# Patient Record
Sex: Male | Born: 1940 | Race: White | Hispanic: No | Marital: Married | Smoking: Never smoker
Health system: Southern US, Community
[De-identification: ages and names within clinical notes are randomized; demographics above are authoritative.]

## PROBLEM LIST (undated history)

## (undated) DIAGNOSIS — I2510 Atherosclerotic heart disease of native coronary artery without angina pectoris: Secondary | ICD-10-CM

## (undated) DIAGNOSIS — K219 Gastro-esophageal reflux disease without esophagitis: Secondary | ICD-10-CM

## (undated) DIAGNOSIS — I10 Essential (primary) hypertension: Secondary | ICD-10-CM

## (undated) DIAGNOSIS — C801 Malignant (primary) neoplasm, unspecified: Secondary | ICD-10-CM

## (undated) DIAGNOSIS — E785 Hyperlipidemia, unspecified: Secondary | ICD-10-CM

## (undated) HISTORY — DX: Hyperlipidemia, unspecified: E78.5

## (undated) HISTORY — DX: Gastro-esophageal reflux disease without esophagitis: K21.9

## (undated) HISTORY — DX: Malignant (primary) neoplasm, unspecified: C80.1

## (undated) HISTORY — PX: LOOP RECORDER IMPLANT: SHX5954

## (undated) HISTORY — DX: Essential (primary) hypertension: I10

## (undated) HISTORY — DX: Atherosclerotic heart disease of native coronary artery without angina pectoris: I25.10

## (undated) HISTORY — PX: CARDIAC SURGERY: SHX584

---

## 2004-04-20 ENCOUNTER — Ambulatory Visit (HOSPITAL_COMMUNITY): Admission: RE | Admit: 2004-04-20 | Discharge: 2004-04-20 | Payer: Self-pay | Admitting: Plastic Surgery

## 2011-08-25 ENCOUNTER — Ambulatory Visit: Payer: Self-pay | Admitting: Cardiology

## 2013-01-22 ENCOUNTER — Ambulatory Visit (INDEPENDENT_AMBULATORY_CARE_PROVIDER_SITE_OTHER): Payer: Medicare Other | Admitting: Podiatrist

## 2013-01-22 ENCOUNTER — Encounter: Payer: Self-pay | Admitting: Podiatrist

## 2013-01-22 DIAGNOSIS — L600 Ingrowing nail: Secondary | ICD-10-CM

## 2013-01-22 NOTE — Progress Notes (Signed)
°  Chief Complaint  Patient presents with   Ingrown Toenail    right big toenail     HPI: Patient is 72 y.o. male who presents today for painful ingrown toenail on the right great toenail. In the past I performed a simple debridement of the toenail which she states helps temporarily however for the past 3 weeks it's been touch she and tender. He's interested in a permanent fix for this toenail.  Physical Exam  GENERAL APPEARANCE: Alert, conversant. Appropriately groomed. No acute distress.  VASCULAR: Pedal pulses palpable and strong bilateral.  Capillary refill time is immediate to all digits,  Proximal to distal cooling it warm to warm.  Digital hair growth is present bilateral  NEUROLOGIC: sensation is intact epicritically and protectively to 5.07 monofilament at 5/5 sites bilateral.  Light touch is intact bilateral, vibratory sensation intact bilateral, achilles tendon reflex is intact bilateral.  MUSCULOSKELETAL: acceptable muscle strength, tone and stability bilateral.  Intrinsic muscluature intact bilateral.  Rectus appearance of foot and digits noted bilateral.   DERMATOLOGIC: skin color, texture, and turger are within normal limits.  No preulcerative lesions are seen, no interdigital maceration noted.  No open lesions present.  Digital nails are asymptomatic. With the exception of the right first medial aspect. Tender and symptomatic especially along the posterior medial border the toenail. No redness, no swelling, no drainage, no malodor noted.  Assessment: Ingrown right hallux nail medial nail border  Plan:Treatment options and alternatives discussed.  Recommended permanent phenol matrixectomy and patient agreed.  Right hallux was prepped with alcohol and a 1 to 1 mix of 0.5% marcaine plain and 2% lidocaine plain was administered in a digital block fashion.  The toe was then prepped with betadine solution and exsanguinated.  The offending nail border was then excised and matrix tissue  exposed.  Phenol was then applied to the matrix tissue followed by an alcohol wash.  Antibiotic ointment and a dry sterile dressing was applied.  The patient was dispensed instructions for aftercare.    Marlowe Aschoff DPM  EGERTON, KATHRYN P

## 2013-01-22 NOTE — Patient Instructions (Signed)

## 2014-03-05 DIAGNOSIS — I4892 Unspecified atrial flutter: Secondary | ICD-10-CM | POA: Diagnosis not present

## 2014-03-05 DIAGNOSIS — I2510 Atherosclerotic heart disease of native coronary artery without angina pectoris: Secondary | ICD-10-CM | POA: Diagnosis not present

## 2014-03-05 DIAGNOSIS — E785 Hyperlipidemia, unspecified: Secondary | ICD-10-CM | POA: Diagnosis not present

## 2014-03-05 DIAGNOSIS — R55 Syncope and collapse: Secondary | ICD-10-CM | POA: Diagnosis not present

## 2014-03-05 DIAGNOSIS — Z959 Presence of cardiac and vascular implant and graft, unspecified: Secondary | ICD-10-CM | POA: Diagnosis not present

## 2014-03-28 DIAGNOSIS — Z79899 Other long term (current) drug therapy: Secondary | ICD-10-CM | POA: Diagnosis not present

## 2014-03-28 DIAGNOSIS — Z0000 Encounter for general adult medical examination without abnormal findings: Secondary | ICD-10-CM | POA: Diagnosis not present

## 2014-03-28 DIAGNOSIS — Z125 Encounter for screening for malignant neoplasm of prostate: Secondary | ICD-10-CM | POA: Diagnosis not present

## 2014-03-28 DIAGNOSIS — E782 Mixed hyperlipidemia: Secondary | ICD-10-CM | POA: Diagnosis not present

## 2014-04-02 DIAGNOSIS — L821 Other seborrheic keratosis: Secondary | ICD-10-CM | POA: Diagnosis not present

## 2014-04-02 DIAGNOSIS — L820 Inflamed seborrheic keratosis: Secondary | ICD-10-CM | POA: Diagnosis not present

## 2014-08-28 DIAGNOSIS — H5213 Myopia, bilateral: Secondary | ICD-10-CM | POA: Diagnosis not present

## 2014-08-28 DIAGNOSIS — H524 Presbyopia: Secondary | ICD-10-CM | POA: Diagnosis not present

## 2014-11-06 DIAGNOSIS — B9689 Other specified bacterial agents as the cause of diseases classified elsewhere: Secondary | ICD-10-CM | POA: Diagnosis not present

## 2014-11-06 DIAGNOSIS — J208 Acute bronchitis due to other specified organisms: Secondary | ICD-10-CM | POA: Diagnosis not present

## 2014-11-17 DIAGNOSIS — J0100 Acute maxillary sinusitis, unspecified: Secondary | ICD-10-CM | POA: Diagnosis not present

## 2014-12-05 DIAGNOSIS — Z23 Encounter for immunization: Secondary | ICD-10-CM | POA: Diagnosis not present

## 2015-04-01 DIAGNOSIS — Z79899 Other long term (current) drug therapy: Secondary | ICD-10-CM | POA: Diagnosis not present

## 2015-04-01 DIAGNOSIS — Z125 Encounter for screening for malignant neoplasm of prostate: Secondary | ICD-10-CM | POA: Diagnosis not present

## 2015-04-01 DIAGNOSIS — E782 Mixed hyperlipidemia: Secondary | ICD-10-CM | POA: Diagnosis not present

## 2015-04-02 DIAGNOSIS — Z95818 Presence of other cardiac implants and grafts: Secondary | ICD-10-CM | POA: Diagnosis not present

## 2015-04-02 DIAGNOSIS — Z1389 Encounter for screening for other disorder: Secondary | ICD-10-CM | POA: Diagnosis not present

## 2015-04-02 DIAGNOSIS — Z8679 Personal history of other diseases of the circulatory system: Secondary | ICD-10-CM | POA: Insufficient documentation

## 2015-04-02 DIAGNOSIS — Z9181 History of falling: Secondary | ICD-10-CM | POA: Diagnosis not present

## 2015-04-02 DIAGNOSIS — Z9889 Other specified postprocedural states: Secondary | ICD-10-CM

## 2015-04-02 DIAGNOSIS — R55 Syncope and collapse: Secondary | ICD-10-CM

## 2015-04-02 DIAGNOSIS — Z0001 Encounter for general adult medical examination with abnormal findings: Secondary | ICD-10-CM | POA: Diagnosis not present

## 2015-04-02 DIAGNOSIS — I2510 Atherosclerotic heart disease of native coronary artery without angina pectoris: Secondary | ICD-10-CM

## 2015-04-02 DIAGNOSIS — G4731 Primary central sleep apnea: Secondary | ICD-10-CM

## 2015-04-02 DIAGNOSIS — I472 Ventricular tachycardia, unspecified: Secondary | ICD-10-CM | POA: Insufficient documentation

## 2015-04-02 DIAGNOSIS — R05 Cough: Secondary | ICD-10-CM | POA: Diagnosis not present

## 2015-04-02 HISTORY — DX: Ventricular tachycardia, unspecified: I47.20

## 2015-04-02 HISTORY — DX: Atherosclerotic heart disease of native coronary artery without angina pectoris: I25.10

## 2015-04-02 HISTORY — DX: Personal history of other diseases of the circulatory system: Z98.890

## 2015-04-02 HISTORY — DX: Ventricular tachycardia: I47.2

## 2015-04-02 HISTORY — DX: Primary central sleep apnea: G47.31

## 2015-04-02 HISTORY — DX: Syncope and collapse: R55

## 2015-04-02 HISTORY — DX: Personal history of other diseases of the circulatory system: Z86.79

## 2015-04-08 DIAGNOSIS — Z1211 Encounter for screening for malignant neoplasm of colon: Secondary | ICD-10-CM | POA: Diagnosis not present

## 2015-06-05 DIAGNOSIS — Z79899 Other long term (current) drug therapy: Secondary | ICD-10-CM | POA: Insufficient documentation

## 2015-06-05 HISTORY — DX: Other long term (current) drug therapy: Z79.899

## 2015-08-13 DIAGNOSIS — K219 Gastro-esophageal reflux disease without esophagitis: Secondary | ICD-10-CM | POA: Diagnosis not present

## 2015-09-01 DIAGNOSIS — Z85820 Personal history of malignant melanoma of skin: Secondary | ICD-10-CM | POA: Diagnosis not present

## 2015-09-01 DIAGNOSIS — Z7982 Long term (current) use of aspirin: Secondary | ICD-10-CM | POA: Diagnosis not present

## 2015-09-01 DIAGNOSIS — I252 Old myocardial infarction: Secondary | ICD-10-CM | POA: Diagnosis not present

## 2015-09-01 DIAGNOSIS — Z955 Presence of coronary angioplasty implant and graft: Secondary | ICD-10-CM | POA: Diagnosis not present

## 2015-09-01 DIAGNOSIS — Z79899 Other long term (current) drug therapy: Secondary | ICD-10-CM | POA: Diagnosis not present

## 2015-09-01 DIAGNOSIS — Z86010 Personal history of colonic polyps: Secondary | ICD-10-CM | POA: Diagnosis not present

## 2015-09-14 DIAGNOSIS — H2513 Age-related nuclear cataract, bilateral: Secondary | ICD-10-CM | POA: Diagnosis not present

## 2015-09-14 DIAGNOSIS — H524 Presbyopia: Secondary | ICD-10-CM | POA: Diagnosis not present

## 2015-09-29 DIAGNOSIS — E782 Mixed hyperlipidemia: Secondary | ICD-10-CM | POA: Diagnosis not present

## 2015-09-29 DIAGNOSIS — Z79899 Other long term (current) drug therapy: Secondary | ICD-10-CM | POA: Diagnosis not present

## 2015-11-05 DIAGNOSIS — I472 Ventricular tachycardia: Secondary | ICD-10-CM | POA: Diagnosis not present

## 2015-11-05 DIAGNOSIS — I2510 Atherosclerotic heart disease of native coronary artery without angina pectoris: Secondary | ICD-10-CM | POA: Diagnosis not present

## 2015-11-05 DIAGNOSIS — R55 Syncope and collapse: Secondary | ICD-10-CM | POA: Diagnosis not present

## 2015-11-05 DIAGNOSIS — Z79899 Other long term (current) drug therapy: Secondary | ICD-10-CM | POA: Diagnosis not present

## 2015-11-05 DIAGNOSIS — Z95818 Presence of other cardiac implants and grafts: Secondary | ICD-10-CM | POA: Diagnosis not present

## 2015-11-16 DIAGNOSIS — L570 Actinic keratosis: Secondary | ICD-10-CM | POA: Diagnosis not present

## 2015-11-16 DIAGNOSIS — L821 Other seborrheic keratosis: Secondary | ICD-10-CM | POA: Diagnosis not present

## 2015-11-16 DIAGNOSIS — R209 Unspecified disturbances of skin sensation: Secondary | ICD-10-CM | POA: Diagnosis not present

## 2015-11-16 DIAGNOSIS — Z23 Encounter for immunization: Secondary | ICD-10-CM | POA: Diagnosis not present

## 2016-04-08 DIAGNOSIS — Z125 Encounter for screening for malignant neoplasm of prostate: Secondary | ICD-10-CM | POA: Diagnosis not present

## 2016-04-08 DIAGNOSIS — Z08 Encounter for follow-up examination after completed treatment for malignant neoplasm: Secondary | ICD-10-CM | POA: Diagnosis not present

## 2016-04-08 DIAGNOSIS — Z79899 Other long term (current) drug therapy: Secondary | ICD-10-CM | POA: Diagnosis not present

## 2016-04-08 DIAGNOSIS — K219 Gastro-esophageal reflux disease without esophagitis: Secondary | ICD-10-CM | POA: Diagnosis not present

## 2016-04-08 DIAGNOSIS — Z0000 Encounter for general adult medical examination without abnormal findings: Secondary | ICD-10-CM | POA: Diagnosis not present

## 2016-04-08 DIAGNOSIS — E782 Mixed hyperlipidemia: Secondary | ICD-10-CM | POA: Diagnosis not present

## 2016-04-13 DIAGNOSIS — D649 Anemia, unspecified: Secondary | ICD-10-CM | POA: Diagnosis not present

## 2016-04-28 DIAGNOSIS — I10 Essential (primary) hypertension: Secondary | ICD-10-CM

## 2016-04-28 DIAGNOSIS — R55 Syncope and collapse: Secondary | ICD-10-CM | POA: Diagnosis not present

## 2016-04-28 DIAGNOSIS — G4731 Primary central sleep apnea: Secondary | ICD-10-CM | POA: Diagnosis not present

## 2016-04-28 DIAGNOSIS — I2510 Atherosclerotic heart disease of native coronary artery without angina pectoris: Secondary | ICD-10-CM | POA: Diagnosis not present

## 2016-04-28 DIAGNOSIS — Z95818 Presence of other cardiac implants and grafts: Secondary | ICD-10-CM | POA: Diagnosis not present

## 2016-04-28 HISTORY — DX: Essential (primary) hypertension: I10

## 2016-07-17 DIAGNOSIS — M545 Low back pain: Secondary | ICD-10-CM | POA: Diagnosis not present

## 2016-09-09 ENCOUNTER — Encounter: Payer: Self-pay | Admitting: Cardiology

## 2016-09-09 ENCOUNTER — Ambulatory Visit (INDEPENDENT_AMBULATORY_CARE_PROVIDER_SITE_OTHER): Payer: Medicare Other | Admitting: Cardiology

## 2016-09-09 ENCOUNTER — Ambulatory Visit (HOSPITAL_BASED_OUTPATIENT_CLINIC_OR_DEPARTMENT_OTHER)
Admission: RE | Admit: 2016-09-09 | Discharge: 2016-09-09 | Disposition: A | Discharge status: Home / Self Care | Payer: Medicare Other | Source: Ambulatory Visit | Attending: Cardiology | Admitting: Cardiology

## 2016-09-09 VITALS — BP 110/60 | HR 68 | Resp 10 | Ht 72.0 in | Wt 186.0 lb

## 2016-09-09 DIAGNOSIS — I472 Ventricular tachycardia, unspecified: Secondary | ICD-10-CM

## 2016-09-09 DIAGNOSIS — R0602 Shortness of breath: Secondary | ICD-10-CM | POA: Diagnosis not present

## 2016-09-09 DIAGNOSIS — I209 Angina pectoris, unspecified: Secondary | ICD-10-CM | POA: Diagnosis not present

## 2016-09-09 DIAGNOSIS — I2510 Atherosclerotic heart disease of native coronary artery without angina pectoris: Secondary | ICD-10-CM

## 2016-09-09 DIAGNOSIS — G4731 Primary central sleep apnea: Secondary | ICD-10-CM | POA: Diagnosis not present

## 2016-09-09 DIAGNOSIS — I10 Essential (primary) hypertension: Secondary | ICD-10-CM

## 2016-09-09 HISTORY — DX: Angina pectoris, unspecified: I20.9

## 2016-09-09 MED ORDER — NITROGLYCERIN 0.4 MG SL SUBL: 0.4000 mg | SUBLINGUAL_TABLET | SUBLINGUAL | 11 refills | Status: AC | PRN

## 2016-09-09 MED ORDER — ROSUVASTATIN CALCIUM 20 MG PO TABS: 20.0000 mg | ORAL_TABLET | Freq: Every day | ORAL | 3 refills | Status: DC

## 2016-09-09 MED ORDER — RANOLAZINE ER 500 MG PO TB12: 500.0000 mg | ORAL_TABLET | Freq: Two times a day (BID) | ORAL | 3 refills | Status: DC

## 2016-09-09 NOTE — Addendum Note (Signed)
Addended by: Warner Mccreedy E on: 09/09/2016 10:48 AM   Modules accepted: Orders

## 2016-09-09 NOTE — Progress Notes (Signed)
Cardiology Office Note:    Date:  09/09/2016   ID:  Scott Whitehead, DOB 09/12/1940, MRN [id number]  PCP:  Myer Peer, MD  Cardiologist:  Jenne Campus, MD    Referring MD: Myer Peer, MD   Chief Complaint  Patient presents with  . Follow-up  I'm having chest pain  History of Present Illness:    Scott Whitehead is a 76 y.o. male  with past medical history significant for coronary artery disease. 6-7 years ago he did have angioplasty done. I do not have details about which artery was intervened on at the moment. He requested to be seen because of exertional chest pain that he develop about a month ago. Anymore strenuous exercise right now will give him chest tightness he stops and pain goes away. Even walking at [facility] from front to the back will bring the pain. He said this is exactly the same thing that I had before on my last angioplasty. He is a red on beta blocker but does not have nitroglycerin to try. We talked about options in this situation option being medical therapy versus stress testing versus cardiac catheterization. He does have past medical history of coronary artery disease, he symptoms of typical I told him that the best approach to the situation will be to go straight for cardiac catheterization. Procedure was explained to him including all risk benefits as well as alternative. He agreed to proceed.  Past Medical History:  Diagnosis Date  . Cancer ([place])   . GERD (gastroesophageal reflux disease)   . Hyperlipidemia   . Hypertension     Past Surgical History:  Procedure Laterality Date  . CARDIAC SURGERY     heart monitor is placed in the heart area  . LOOP RECORDER IMPLANT      Current Medications: Current Meds  Medication Sig  . aspirin 81 MG tablet Take 81 mg by mouth daily.  Marland Kitchen lisinopril (PRINIVIL,ZESTRIL) 5 MG tablet Take 5 mg by mouth daily.  . metoprolol tartrate (LOPRESSOR) 25 MG tablet Take 25 mg by mouth 2 (two) times daily.  . Multiple  Vitamin (MULTIVITAMIN) tablet Take 1 tablet by mouth daily.  . Omega-3 Fatty Acids (FISH OIL) 1000 MG CAPS Take by mouth.  . pantoprazole (PROTONIX) 40 MG tablet Take 40 mg by mouth daily.  . Potassium 99 MG TABS Take 1 tablet by mouth daily.  . simvastatin (ZOCOR) 20 MG tablet Take 20 mg by mouth daily.     Allergies:   Patient has no known allergies.   Social History   Social History  . Marital status: Married    Spouse name: N/A  . Number of children: N/A  . Years of education: N/A   Social History Main Topics  . Smoking status: Never Smoker  . Smokeless tobacco: Never Used  . Alcohol use No  . Drug use: No  . Sexual activity: Not Asked   Other Topics Concern  . None   Social History Narrative  . None     Family History: The patient's family history includes Alzheimer's disease in his father; Blindness in his father; Glaucoma in his father; Heart disease in his mother; Hypertension in his father and mother. ROS:   Please see the history of present illness.    All 14 point review of systems negative except as described per history of present illness  EKGs/Labs/Other Studies Reviewed:      Recent Labs: No results found for requested labs within last 8760 hours.  Recent Lipid Panel No results found for: CHOL, TRIG, HDL, CHOLHDL, VLDL, LDLCALC, LDLDIRECT  Physical Exam:    VS:  BP 110/60   Pulse 68   Resp 10   Ht 6' (1.829 m)   Wt 186 lb (84.4 kg)   BMI 25.23 kg/m     Wt Readings from Last 3 Encounters:  09/09/16 186 lb (84.4 kg)     GEN:  Well nourished, well developed in no acute distress HEENT: Normal NECK: No JVD; No carotid bruits LYMPHATICS: No lymphadenopathy CARDIAC: RRR, no murmurs, no rubs, no gallops RESPIRATORY:  Clear to auscultation without rales, wheezing or rhonchi  ABDOMEN: Soft, non-tender, non-distended MUSCULOSKELETAL:  No edema; No deformity  SKIN: Warm and dry LOWER EXTREMITIES: no swelling NEUROLOGIC:  Alert and oriented x  3 PSYCHIATRIC:  Normal affect   ASSESSMENT:    1. Coronary artery disease involving native coronary artery of native heart without angina pectoris   2. Essential hypertension   3. VT (ventricular tachycardia) ([place])   4. Primary central sleep apnea   5. Angina pectoris ([place])    PLAN:    In order of problems listed above:  1. Typical angina pectoris in this gentleman with history of coronary artery disease. I asked him to continue aspirin, I will switch his low intensity statin to high intensity statin I will give him Crestor 20 mg daily. He is already on beta blocker which I will continue. I will opt ranolazine 500 mg twice daily to his medical regimen as well as nitroglycerin that had advised him to take on when necessary basis. I also told him that was does not help with the pain he needs to go to the emergency room. He will be scheduled to have cardiac catheterization. 2. Essential hypertension: Blood pressure appears to be well-controlled will continue present management. 3. History of ventricular tachycardia: He was evaluated by Dr. Minna Merritts. He does have implantable loop recorder however the battery already drained out and device is not functioning. It never recorded any significant arrhythmia. In the future we will plan to remove it. 4. Sleep apnea: That's been followed by internal medicine team.   Medication Adjustments/Labs and Tests Ordered: Current medicines are reviewed at length with the patient today.  Concerns regarding medicines are outlined above.  No orders of the defined types were placed in this encounter.  Medication changes: No orders of the defined types were placed in this encounter.   Signed, Park Liter, MD, [facility] 09/09/2016 10:25 AM    [place]

## 2016-09-09 NOTE — Patient Instructions (Addendum)
Medication Instructions:  Your physician has recommended you make the following change in your medication:  STOP simvastatin START rosuvastatin (Crestor) 20 mg daily START ranolazine (Ranexa) 500 mg tablet twice daily START nitroglycerin 0.4 mg sublingual (under your tongue) as needed. When having chest pain, stop what you are doing and sit down. Take 1 nitro, wait 5 minutes. Still having chest pain, take 1 nitro, wait 5 minutes. Still having chest pain, take 1 nitro, dial 911. Total of 3 nitro in 15 minutes.   Labwork: Your physician recommends that you return for lab work in: today. CMP, CBC   Testing/Procedures: You will have a cardiac catherization 09/09/16.  Follow-Up: Your physician recommends that you schedule a follow-up appointment in: 1 month   Any Other Special Instructions Will Be Listed Below (If Applicable).     If you need a refill on your cardiac medications before your next appointment, please call your pharmacy.

## 2016-09-09 NOTE — Addendum Note (Signed)
Addended by: Warner Mccreedy E on: 09/09/2016 10:56 AM   Modules accepted: Orders

## 2016-09-10 LAB — COMPREHENSIVE METABOLIC PANEL
A/G RATIO: 2 (ref 1.2–2.2)
ALBUMIN: 4.3 g/dL (ref 3.5–4.8)
ALT: 18 IU/L (ref 0–44)
AST: 24 IU/L (ref 0–40)
Alkaline Phosphatase: 56 IU/L (ref 39–117)
BILIRUBIN TOTAL: 0.6 mg/dL (ref 0.0–1.2)
BUN / CREAT RATIO: 24 (ref 10–24)
BUN: 24 mg/dL (ref 8–27)
CHLORIDE: 103 mmol/L (ref 96–106)
CO2: 25 mmol/L (ref 20–29)
Calcium: 9.5 mg/dL (ref 8.6–10.2)
Creatinine, Ser: 1 mg/dL (ref 0.76–1.27)
GFR, EST AFRICAN AMERICAN: 85 mL/min/{1.73_m2} (ref >59)
GFR, EST NON AFRICAN AMERICAN: 73 mL/min/{1.73_m2} (ref >59)
GLUCOSE: 76 mg/dL (ref 65–99)
Globulin, Total: 2.2 g/dL (ref 1.5–4.5)
POTASSIUM: 5.5 mmol/L — AB (ref 3.5–5.2)
Sodium: 141 mmol/L (ref 134–144)
Total Protein: 6.5 g/dL (ref 6.0–8.5)

## 2016-09-10 LAB — CBC WITH DIFFERENTIAL/PLATELET
BASOS: 0 %
Basophils Absolute: 0 10*3/uL (ref 0.0–0.2)
EOS (ABSOLUTE): 0.3 10*3/uL (ref 0.0–0.4)
EOS: 5 %
HEMATOCRIT: 42.9 % (ref 37.5–51.0)
Hemoglobin: 13.9 g/dL (ref 13.0–17.7)
Immature Grans (Abs): 0 10*3/uL (ref 0.0–0.1)
Immature Granulocytes: 0 %
LYMPHS: 28 %
Lymphocytes Absolute: 1.3 10*3/uL (ref 0.7–3.1)
MCH: 32.2 pg (ref 26.6–33.0)
MCHC: 32.4 g/dL (ref 31.5–35.7)
MCV: 99 fL — AB (ref 79–97)
MONOS ABS: 0.4 10*3/uL (ref 0.1–0.9)
Monocytes: 10 %
NEUTROS PCT: 57 %
Neutrophils Absolute: 2.6 10*3/uL (ref 1.4–7.0)
PLATELETS: 190 10*3/uL (ref 150–379)
RBC: 4.32 x10E6/uL (ref 4.14–5.80)
RDW: 14.2 % (ref 12.3–15.4)
WBC: 4.6 10*3/uL (ref 3.4–10.8)

## 2016-09-12 ENCOUNTER — Telehealth: Payer: Self-pay

## 2016-09-12 NOTE — Telephone Encounter (Signed)
Left detailed message per DPR.  Patient contacted pre-catheterization at [facility] scheduled for:  09/13/2016 @ 1330 Verified arrival time and place:  NT @ 1130  Confirmed AM meds to be taken pre-cath with sip of water:  Notified to take baby ASA prior to arrival  Notified patient must have responsible person to drive home post procedure and observe patient for 24 hours Addl concerns:  Left this nurse name and # for call back if any questions.

## 2016-09-13 ENCOUNTER — Ambulatory Visit (HOSPITAL_COMMUNITY)
Admission: RE | Admit: 2016-09-13 | Discharge: 2016-09-13 | Disposition: A | Discharge status: Home / Self Care | Payer: Medicare Other | Source: Ambulatory Visit | Attending: Cardiology | Admitting: Cardiology

## 2016-09-13 ENCOUNTER — Encounter (HOSPITAL_COMMUNITY)
Admission: RE | Disposition: A | Discharge status: Home / Self Care | Payer: Self-pay | Source: Ambulatory Visit | Attending: Cardiology

## 2016-09-13 DIAGNOSIS — G4731 Primary central sleep apnea: Secondary | ICD-10-CM | POA: Diagnosis not present

## 2016-09-13 DIAGNOSIS — E785 Hyperlipidemia, unspecified: Secondary | ICD-10-CM

## 2016-09-13 DIAGNOSIS — I209 Angina pectoris, unspecified: Secondary | ICD-10-CM | POA: Diagnosis present

## 2016-09-13 DIAGNOSIS — I25119 Atherosclerotic heart disease of native coronary artery with unspecified angina pectoris: Secondary | ICD-10-CM | POA: Diagnosis not present

## 2016-09-13 DIAGNOSIS — I472 Ventricular tachycardia: Secondary | ICD-10-CM | POA: Insufficient documentation

## 2016-09-13 DIAGNOSIS — Z7982 Long term (current) use of aspirin: Secondary | ICD-10-CM | POA: Diagnosis not present

## 2016-09-13 DIAGNOSIS — Z9861 Coronary angioplasty status: Secondary | ICD-10-CM

## 2016-09-13 DIAGNOSIS — I2510 Atherosclerotic heart disease of native coronary artery without angina pectoris: Secondary | ICD-10-CM

## 2016-09-13 DIAGNOSIS — I10 Essential (primary) hypertension: Secondary | ICD-10-CM | POA: Diagnosis not present

## 2016-09-13 DIAGNOSIS — K219 Gastro-esophageal reflux disease without esophagitis: Secondary | ICD-10-CM | POA: Diagnosis not present

## 2016-09-13 HISTORY — DX: Hyperlipidemia, unspecified: E78.5

## 2016-09-13 HISTORY — PX: LEFT HEART CATH AND CORONARY ANGIOGRAPHY: CATH118249

## 2016-09-13 LAB — PROTIME-INR
INR: 1.09
Prothrombin Time: 14.1 seconds (ref 11.4–15.2)

## 2016-09-13 SURGERY — LEFT HEART CATH AND CORONARY ANGIOGRAPHY
Anesthesia: LOCAL

## 2016-09-13 MED ORDER — MIDAZOLAM HCL 2 MG/2ML IJ SOLN
INTRAMUSCULAR | Status: DC | PRN
  Administered 2016-09-13: 2 mg via INTRAVENOUS

## 2016-09-13 MED ORDER — HEPARIN (PORCINE) IN NACL 2-0.9 UNIT/ML-% IJ SOLN
INTRAMUSCULAR | Status: AC
  Filled 2016-09-13: qty 1000

## 2016-09-13 MED ORDER — SODIUM CHLORIDE 0.9% FLUSH: 3.0000 mL | INTRAVENOUS | Status: DC | PRN

## 2016-09-13 MED ORDER — LIDOCAINE HCL (PF) 1 % IJ SOLN
INTRAMUSCULAR | Status: AC
  Filled 2016-09-13: qty 30

## 2016-09-13 MED ORDER — FENTANYL CITRATE (PF) 100 MCG/2ML IJ SOLN
INTRAMUSCULAR | Status: AC
Start: 2016-09-13 — End: ?
  Filled 2016-09-13: qty 2

## 2016-09-13 MED ORDER — IOPAMIDOL (ISOVUE-370) INJECTION 76%
INTRAVENOUS | Status: AC
  Filled 2016-09-13: qty 100

## 2016-09-13 MED ORDER — ACETAMINOPHEN 325 MG PO TABS: 650.0000 mg | ORAL_TABLET | ORAL | Status: DC | PRN

## 2016-09-13 MED ORDER — SODIUM CHLORIDE 0.9 % IV SOLN: 250.0000 mL | INTRAVENOUS | Status: DC | PRN

## 2016-09-13 MED ORDER — VERAPAMIL HCL 2.5 MG/ML IV SOLN
INTRAVENOUS | Status: DC | PRN
  Administered 2016-09-13: 15:00:00 via INTRA_ARTERIAL

## 2016-09-13 MED ORDER — VERAPAMIL HCL 2.5 MG/ML IV SOLN
INTRAVENOUS | Status: AC
  Filled 2016-09-13: qty 2

## 2016-09-13 MED ORDER — MIDAZOLAM HCL 2 MG/2ML IJ SOLN
INTRAMUSCULAR | Status: AC
  Filled 2016-09-13: qty 2

## 2016-09-13 MED ORDER — SODIUM CHLORIDE 0.9 % WEIGHT BASED INFUSION
3.0000 mL/kg/h | INTRAVENOUS | Status: AC
  Administered 2016-09-13: 3 mL/kg/h via INTRAVENOUS

## 2016-09-13 MED ORDER — IOPAMIDOL (ISOVUE-370) INJECTION 76%
INTRAVENOUS | Status: DC | PRN
  Administered 2016-09-13: 75 mL via INTRA_ARTERIAL

## 2016-09-13 MED ORDER — SODIUM CHLORIDE 0.9 % WEIGHT BASED INFUSION: 1.0000 mL/kg/h | INTRAVENOUS | Status: DC

## 2016-09-13 MED ORDER — SODIUM CHLORIDE 0.9 % IV SOLN: INTRAVENOUS | Status: DC

## 2016-09-13 MED ORDER — SODIUM CHLORIDE 0.9% FLUSH: 3.0000 mL | Freq: Two times a day (BID) | INTRAVENOUS | Status: DC

## 2016-09-13 MED ORDER — FENTANYL CITRATE (PF) 100 MCG/2ML IJ SOLN
INTRAMUSCULAR | Status: DC | PRN
  Administered 2016-09-13: 25 ug via INTRAVENOUS

## 2016-09-13 MED ORDER — ASPIRIN 81 MG PO CHEW: 81.0000 mg | CHEWABLE_TABLET | ORAL | Status: DC

## 2016-09-13 MED ORDER — HEPARIN (PORCINE) IN NACL 2-0.9 UNIT/ML-% IJ SOLN
INTRAMUSCULAR | Status: AC | PRN
  Administered 2016-09-13: 1000 mL

## 2016-09-13 MED ORDER — LIDOCAINE HCL (PF) 1 % IJ SOLN
INTRAMUSCULAR | Status: DC | PRN
  Administered 2016-09-13: 2 mL

## 2016-09-13 MED ORDER — HEPARIN SODIUM (PORCINE) 1000 UNIT/ML IJ SOLN
INTRAMUSCULAR | Status: DC | PRN
  Administered 2016-09-13: 5000 [IU] via INTRAVENOUS

## 2016-09-13 MED ORDER — ONDANSETRON HCL 4 MG/2ML IJ SOLN: 4.0000 mg | Freq: Four times a day (QID) | INTRAMUSCULAR | Status: DC | PRN

## 2016-09-13 MED ORDER — HEPARIN SODIUM (PORCINE) 1000 UNIT/ML IJ SOLN
INTRAMUSCULAR | Status: AC
  Filled 2016-09-13: qty 1

## 2016-09-13 SURGICAL SUPPLY — 9 items
CATH OPTITORQUE TIG 4.0 5F (CATHETERS) ×1 IMPLANT
DEVICE RAD COMP TR BAND LRG (VASCULAR PRODUCTS) ×1 IMPLANT
GLIDESHEATH SLEND A-KIT 6F 22G (SHEATH) ×1 IMPLANT
GUIDEWIRE INQWIRE 1.5J.035X260 (WIRE) IMPLANT
INQWIRE 1.5J .035X260CM (WIRE) ×2
KIT HEART LEFT (KITS) ×2 IMPLANT
PACK CARDIAC CATHETERIZATION (CUSTOM PROCEDURE TRAY) ×2 IMPLANT
TRANSDUCER W/STOPCOCK (MISCELLANEOUS) ×2 IMPLANT
TUBING CIL FLEX 10 FLL-RA (TUBING) ×2 IMPLANT

## 2016-09-13 NOTE — Discharge Instructions (Signed)

## 2016-09-13 NOTE — Research (Signed)
OPTIMIZE Research Study Informed Consent   Subject Name: Scott Whitehead  Subject met inclusion and exclusion criteria.  The informed consent form, study requirements and expectations were reviewed with the subject and questions and concerns were addressed prior to the signing of the consent form.  The subject verbalized understanding of the trail requirements.  The subject agreed to participate in the OPTIMIZE trial and signed the informed consent at 1420 on 09/13/2016.  The informed consent was obtained prior to performance of any protocol-specific procedures for the subject.  A copy of the signed informed consent was given to the subject and a copy was placed in the subject's medical record.  Blossom Hoops 09/13/2016, 4:10 PM

## 2016-09-13 NOTE — Interval H&P Note (Signed)
History and Physical Interval Note:  09/13/2016 2:39 PM  Scott Whitehead  has presented today for surgery, with the diagnosis of chest pain - concerning for crescendo angina (class III)  The various methods of treatment have been discussed with the patient and family. After consideration of risks, benefits and other options for treatment, the patient has consented to  Procedure(s): Left Heart Cath and Coronary Angiography (N/A) with possible Percutaneous Coronary Intervention as a surgical intervention .  The patient's history has been reviewed, patient examined, no change in status, stable for surgery.  I have reviewed the patient's chart and labs.  Questions were answered to the patient's satisfaction.    Cath Lab Visit (complete for each Cath Lab visit)  Clinical Evaluation Leading to the Procedure:   ACS: No.  Non-ACS:    Anginal Classification: CCS III  Anti-ischemic medical therapy: Minimal Therapy (1 class of medications)  Non-Invasive Test Results: No non-invasive testing performed  Prior CABG: No previous CABG    Glenetta Hew

## 2016-09-13 NOTE — H&P (View-Only) (Signed)
Cardiology Office Note:    Date:  09/09/2016   ID:  Scott Whitehead, DOB 03-21-1940, MRN [id number]  PCP:  Myer Peer, MD  Cardiologist:  Jenne Campus, MD    Referring MD: Myer Peer, MD   Chief Complaint  Patient presents with  . Follow-up  I'm having chest pain  History of Present Illness:    Scott Whitehead is a 76 y.o. male  with past medical history significant for coronary artery disease. 6-7 years ago he did have angioplasty done. I do not have details about which artery was intervened on at the moment. He requested to be seen because of exertional chest pain that he develop about a month ago. Anymore strenuous exercise right now will give him chest tightness he stops and pain goes away. Even walking at [facility] from front to the back will bring the pain. He said this is exactly the same thing that I had before on my last angioplasty. He is a red on beta blocker but does not have nitroglycerin to try. We talked about options in this situation option being medical therapy versus stress testing versus cardiac catheterization. He does have past medical history of coronary artery disease, he symptoms of typical I told him that the best approach to the situation will be to go straight for cardiac catheterization. Procedure was explained to him including all risk benefits as well as alternative. He agreed to proceed.  Past Medical History:  Diagnosis Date  . Cancer ([place])   . GERD (gastroesophageal reflux disease)   . Hyperlipidemia   . Hypertension     Past Surgical History:  Procedure Laterality Date  . CARDIAC SURGERY     heart monitor is placed in the heart area  . LOOP RECORDER IMPLANT      Current Medications: Current Meds  Medication Sig  . aspirin 81 MG tablet Take 81 mg by mouth daily.  Marland Kitchen lisinopril (PRINIVIL,ZESTRIL) 5 MG tablet Take 5 mg by mouth daily.  . metoprolol tartrate (LOPRESSOR) 25 MG tablet Take 25 mg by mouth 2 (two) times daily.  . Multiple  Vitamin (MULTIVITAMIN) tablet Take 1 tablet by mouth daily.  . Omega-3 Fatty Acids (FISH OIL) 1000 MG CAPS Take by mouth.  . pantoprazole (PROTONIX) 40 MG tablet Take 40 mg by mouth daily.  . Potassium 99 MG TABS Take 1 tablet by mouth daily.  . simvastatin (ZOCOR) 20 MG tablet Take 20 mg by mouth daily.     Allergies:   Patient has no known allergies.   Social History   Social History  . Marital status: Married    Spouse name: N/A  . Number of children: N/A  . Years of education: N/A   Social History Main Topics  . Smoking status: Never Smoker  . Smokeless tobacco: Never Used  . Alcohol use No  . Drug use: No  . Sexual activity: Not Asked   Other Topics Concern  . None   Social History Narrative  . None     Family History: The patient's family history includes Alzheimer's disease in his father; Blindness in his father; Glaucoma in his father; Heart disease in his mother; Hypertension in his father and mother. ROS:   Please see the history of present illness.    All 14 point review of systems negative except as described per history of present illness  EKGs/Labs/Other Studies Reviewed:      Recent Labs: No results found for requested labs within last 8760 hours.  Recent Lipid Panel No results found for: CHOL, TRIG, HDL, CHOLHDL, VLDL, LDLCALC, LDLDIRECT  Physical Exam:    VS:  BP 110/60   Pulse 68   Resp 10   Ht 6' (1.829 m)   Wt 186 lb (84.4 kg)   BMI 25.23 kg/m     Wt Readings from Last 3 Encounters:  09/09/16 186 lb (84.4 kg)     GEN:  Well nourished, well developed in no acute distress HEENT: Normal NECK: No JVD; No carotid bruits LYMPHATICS: No lymphadenopathy CARDIAC: RRR, no murmurs, no rubs, no gallops RESPIRATORY:  Clear to auscultation without rales, wheezing or rhonchi  ABDOMEN: Soft, non-tender, non-distended MUSCULOSKELETAL:  No edema; No deformity  SKIN: Warm and dry LOWER EXTREMITIES: no swelling NEUROLOGIC:  Alert and oriented x  3 PSYCHIATRIC:  Normal affect   ASSESSMENT:    1. Coronary artery disease involving native coronary artery of native heart without angina pectoris   2. Essential hypertension   3. VT (ventricular tachycardia) ([place])   4. Primary central sleep apnea   5. Angina pectoris ([place])    PLAN:    In order of problems listed above:  1. Typical angina pectoris in this gentleman with history of coronary artery disease. I asked him to continue aspirin, I will switch his low intensity statin to high intensity statin I will give him Crestor 20 mg daily. He is already on beta blocker which I will continue. I will opt ranolazine 500 mg twice daily to his medical regimen as well as nitroglycerin that had advised him to take on when necessary basis. I also told him that was does not help with the pain he needs to go to the emergency room. He will be scheduled to have cardiac catheterization. 2. Essential hypertension: Blood pressure appears to be well-controlled will continue present management. 3. History of ventricular tachycardia: He was evaluated by Dr. Minna Merritts. He does have implantable loop recorder however the battery already drained out and device is not functioning. It never recorded any significant arrhythmia. In the future we will plan to remove it. 4. Sleep apnea: That's been followed by internal medicine team.   Medication Adjustments/Labs and Tests Ordered: Current medicines are reviewed at length with the patient today.  Concerns regarding medicines are outlined above.  No orders of the defined types were placed in this encounter.  Medication changes: No orders of the defined types were placed in this encounter.   Signed, Park Liter, MD, [facility] 09/09/2016 10:25 AM    [place]

## 2016-09-14 ENCOUNTER — Encounter (HOSPITAL_COMMUNITY): Payer: Self-pay | Admitting: Cardiology

## 2016-09-19 DIAGNOSIS — H2513 Age-related nuclear cataract, bilateral: Secondary | ICD-10-CM | POA: Diagnosis not present

## 2016-10-04 ENCOUNTER — Encounter: Payer: Self-pay | Admitting: Cardiology

## 2016-10-04 ENCOUNTER — Ambulatory Visit (INDEPENDENT_AMBULATORY_CARE_PROVIDER_SITE_OTHER): Payer: Medicare Other | Admitting: Cardiology

## 2016-10-04 VITALS — BP 124/66 | HR 64 | Resp 10 | Ht 72.0 in | Wt 186.4 lb

## 2016-10-04 DIAGNOSIS — Z9861 Coronary angioplasty status: Secondary | ICD-10-CM

## 2016-10-04 DIAGNOSIS — R0602 Shortness of breath: Secondary | ICD-10-CM

## 2016-10-04 DIAGNOSIS — Z9889 Other specified postprocedural states: Secondary | ICD-10-CM

## 2016-10-04 DIAGNOSIS — I2510 Atherosclerotic heart disease of native coronary artery without angina pectoris: Secondary | ICD-10-CM

## 2016-10-04 DIAGNOSIS — I10 Essential (primary) hypertension: Secondary | ICD-10-CM | POA: Diagnosis not present

## 2016-10-04 DIAGNOSIS — E785 Hyperlipidemia, unspecified: Secondary | ICD-10-CM | POA: Diagnosis not present

## 2016-10-04 DIAGNOSIS — Z8679 Personal history of other diseases of the circulatory system: Secondary | ICD-10-CM | POA: Diagnosis not present

## 2016-10-04 NOTE — Progress Notes (Signed)
Cardiology Office Note:    Date:  10/04/2016   ID:  Scott Whitehead, DOB Jun 07, 1940, MRN [id number]  PCP:  Scott Peer, MD  Cardiologist:  Scott Campus, MD    Referring MD: Scott Peer, MD   Chief Complaint  Patient presents with  . Follow up post Cath  Status post cardiac catheterization  History of Present Illness:    Scott Whitehead is a 76 y.o. male  with coronary artery disease. He presented to my office last time complaining of shortness of breath and exertional tightness. Cardiac catheterization was done on this visit is after cardiac catheterization. Luckily cardiac catheterization did not show any obstructive lesions. He did have some diastolic dysfunction. Still complaining of having some exertional shortness of breath and we started talking about what could be potential reasons for that. Eventually reach conclusion that we need to get Chem-7 proBNP trying to see if there is any complement of systolic congestive heart failure. That's the case will try to do some diuretic. He used to walk on the radial basis making 3-4 miles every day however about a year ago he stopped doing that because of the fact he had to take care of his wife. Some potential escalation for shortness of breath could be deconditioning as well.  Past Medical History:  Diagnosis Date  . Cancer ([place])   . GERD (gastroesophageal reflux disease)   . Hyperlipidemia   . Hypertension     Past Surgical History:  Procedure Laterality Date  . CARDIAC SURGERY     heart monitor is placed in the heart area  . LEFT HEART CATH AND CORONARY ANGIOGRAPHY N/A 09/13/2016   Procedure: Left Heart Cath and Coronary Angiography;  Surgeon: Leonie Man, MD;  Location: [place] [facility];  Service: Cardiovascular;  Laterality: N/A;  . LOOP RECORDER IMPLANT      Current Medications: Current Meds  Medication Sig  . aspirin EC 81 MG tablet Take 81 mg by mouth daily.  . diphenhydrAMINE (BENADRYL) 25 MG tablet Take  25 mg by mouth at bedtime as needed for sleep.  Marland Kitchen ibuprofen (ADVIL,MOTRIN) 200 MG tablet Take 400 mg by mouth every 8 (eight) hours as needed (for pain.).  Marland Kitchen lisinopril (PRINIVIL,ZESTRIL) 5 MG tablet Take 5 mg by mouth daily.  . metoprolol tartrate (LOPRESSOR) 25 MG tablet Take 25 mg by mouth 2 (two) times daily.  . Multiple Vitamin (MULTIVITAMIN WITH MINERALS) TABS tablet Take 1 tablet by mouth daily.  . nitroGLYCERIN (NITROSTAT) 0.4 MG SL tablet Place 1 tablet (0.4 mg total) under the tongue every 5 (five) minutes as needed for chest pain.  . Omega-3 Fatty Acids (FISH OIL) 1000 MG CAPS Take 1,000 mg by mouth 2 (two) times daily.   . pantoprazole (PROTONIX) 40 MG tablet Take 40 mg by mouth daily.  . ranolazine (RANEXA) 500 MG 12 hr tablet Take 1 tablet (500 mg total) by mouth 2 (two) times daily.  . rosuvastatin (CRESTOR) 20 MG tablet Take 1 tablet (20 mg total) by mouth daily.     Allergies:   Patient has no known allergies.   Social History   Social History  . Marital status: Married    Spouse name: N/A  . Number of children: N/A  . Years of education: N/A   Social History Main Topics  . Smoking status: Never Smoker  . Smokeless tobacco: Never Used  . Alcohol use No  . Drug use: No  . Sexual activity: Not Asked   Other  Topics Concern  . None   Social History Narrative  . None     Family History: The patient's family history includes Alzheimer's disease in his father; Blindness in his father; Glaucoma in his father; Heart disease in his mother; Hypertension in his father and mother. ROS:   Please see the history of present illness.    All 14 point review of systems negative except as described per history of present illness  EKGs/Labs/Other Studies Reviewed:      Recent Labs: 09/09/2016: ALT 18; BUN 24; Creatinine, Ser 1.00; Hemoglobin 13.9; Platelets 190; Potassium 5.5; Sodium 141  Recent Lipid Panel No results found for: CHOL, TRIG, HDL, CHOLHDL, VLDL, LDLCALC,  LDLDIRECT  Physical Exam:    VS:  BP 124/66   Pulse 64   Resp 10   Ht 6' (1.829 m)   Wt 186 lb 6.4 oz (84.6 kg)   BMI 25.28 kg/m     Wt Readings from Last 3 Encounters:  10/04/16 186 lb 6.4 oz (84.6 kg)  09/13/16 185 lb (83.9 kg)  09/09/16 186 lb (84.4 kg)     GEN:  Well nourished, well developed in no acute distress HEENT: Normal NECK: No JVD; No carotid bruits LYMPHATICS: No lymphadenopathy CARDIAC: RRR, no murmurs, no rubs, no gallops RESPIRATORY:  Clear to auscultation without rales, wheezing or rhonchi  ABDOMEN: Soft, non-tender, non-distended MUSCULOSKELETAL:  No edema; No deformity  SKIN: Warm and dry LOWER EXTREMITIES: no swelling NEUROLOGIC:  Alert and oriented x 3 PSYCHIATRIC:  Normal affect   ASSESSMENT:    1. Coronary artery disease involving native coronary artery of native heart without angina pectoris   2. Essential hypertension   3. CAD S/P percutaneous coronary angioplasty   4. Status post ablation of atrial flutter   5. Hyperlipidemia with target low density lipoprotein (LDL) cholesterol less than 70 mg/dL    PLAN:    In order of problems listed above:  1. Coronary artery disease: I will ask on a catheterization showing no obstructive lesion. 2. Diastolic dysfunctions: We'll do proBNP and Chem-7 base of bedside about therapy 3. Hyperlipidemia: On statin high intensity which I will continue. 4. 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  He may require pulmonary function tests in the future.   Medication Adjustments/Labs and Tests Ordered: Current medicines are reviewed at length with the patient today.  Concerns regarding medicines are outlined above.  No orders of the defined types were placed in this encounter.  Medication changes: No orders of the defined types were placed in this  encounter.   Signed, Park Liter, MD, [facility] 10/04/2016 9:07 AM    [place]

## 2016-10-04 NOTE — Patient Instructions (Signed)
Medication Instructions:  Your physician recommends that you continue on your current medications as directed. Please refer to the Current Medication list given to you today.  Labwork: Your physician recommends that you have lab work today in office to check your kidney function, sodium and potassium as well as check for fluid build up.   Testing/Procedures: None   Follow-Up: Your physician recommends that you schedule a follow-up appointment in: 1 month   Any Other Special Instructions Will Be Listed Below (If Applicable).  Please note that any paperwork needing to be filled out by the provider will need to be addressed at the front desk prior to seeing the provider. Please note that any paperwork FMLA, Disability or other documents regarding health condition is subject to a $25.00 charge that must be received prior to completion of paperwork in the form of a money order or check.     If you need a refill on your cardiac medications before your next appointment, please call your pharmacy.

## 2016-10-05 LAB — PRO B NATRIURETIC PEPTIDE: NT-Pro BNP: 72 pg/mL (ref 0–486)

## 2016-10-05 LAB — BASIC METABOLIC PANEL
BUN/Creatinine Ratio: 19 (ref 10–24)
BUN: 20 mg/dL (ref 8–27)
CO2: 27 mmol/L (ref 20–29)
CREATININE: 1.08 mg/dL (ref 0.76–1.27)
Calcium: 9.7 mg/dL (ref 8.6–10.2)
Chloride: 104 mmol/L (ref 96–106)
GFR calc Af Amer: 77 mL/min/{1.73_m2} (ref >59)
GFR calc non Af Amer: 67 mL/min/{1.73_m2} (ref >59)
Glucose: 72 mg/dL (ref 65–99)
Potassium: 5.2 mmol/L (ref 3.5–5.2)
Sodium: 142 mmol/L (ref 134–144)

## 2016-10-07 ENCOUNTER — Ambulatory Visit: Payer: Medicare Other | Admitting: Cardiology

## 2016-11-04 ENCOUNTER — Ambulatory Visit: Payer: Medicare Other | Admitting: Cardiology

## 2016-11-09 ENCOUNTER — Ambulatory Visit (INDEPENDENT_AMBULATORY_CARE_PROVIDER_SITE_OTHER): Payer: Medicare Other | Admitting: Cardiology

## 2016-11-09 ENCOUNTER — Encounter: Payer: Self-pay | Admitting: Cardiology

## 2016-11-09 VITALS — BP 120/58 | HR 73 | Ht 72.0 in | Wt 186.0 lb

## 2016-11-09 DIAGNOSIS — I472 Ventricular tachycardia, unspecified: Secondary | ICD-10-CM

## 2016-11-09 DIAGNOSIS — Z9861 Coronary angioplasty status: Secondary | ICD-10-CM

## 2016-11-09 DIAGNOSIS — R55 Syncope and collapse: Secondary | ICD-10-CM | POA: Diagnosis not present

## 2016-11-09 DIAGNOSIS — I10 Essential (primary) hypertension: Secondary | ICD-10-CM | POA: Diagnosis not present

## 2016-11-09 DIAGNOSIS — I209 Angina pectoris, unspecified: Secondary | ICD-10-CM | POA: Diagnosis not present

## 2016-11-09 DIAGNOSIS — R42 Dizziness and giddiness: Secondary | ICD-10-CM | POA: Diagnosis not present

## 2016-11-09 DIAGNOSIS — I2510 Atherosclerotic heart disease of native coronary artery without angina pectoris: Secondary | ICD-10-CM | POA: Diagnosis not present

## 2016-11-09 HISTORY — DX: Dizziness and giddiness: R42

## 2016-11-09 MED ORDER — ROSUVASTATIN CALCIUM 20 MG PO TABS: 20.0000 mg | ORAL_TABLET | Freq: Every day | ORAL | 3 refills | Status: DC

## 2016-11-09 NOTE — Progress Notes (Signed)
Cardiology Office Note:    Date:  11/09/2016   ID:  Scott Whitehead, DOB 16-Jan-1941, MRN [id number]  PCP:  Myer Peer, MD  Cardiologist:  Jenne Campus, MD    Referring MD: Myer Peer, MD   Chief Complaint  Patient presents with  . Follow-up    No improvement since last OV   I'm having dizzy spells  History of Present Illness:    Scott Whitehead is a 76 y.o. male  Still described to have some shortness of breath but much less than before, cardiac catheterization showing nonobstructive disease, pressure however was elevated indicating diastolic dysfunction. I'll check his proBNP which was perfectly within normal limits. Today however he tells me about dizziness that happens at different situations however many times it happens with exertion.He did not passd out he did not reach the point that he to sit down with this dizziness. But still concern for about it to talk to me about this.  Past Medical History:  Diagnosis Date  . Cancer ([place])   . GERD (gastroesophageal reflux disease)   . Hyperlipidemia   . Hypertension     Past Surgical History:  Procedure Laterality Date  . CARDIAC SURGERY     heart monitor is placed in the heart area  . LEFT HEART CATH AND CORONARY ANGIOGRAPHY N/A 09/13/2016   Procedure: Left Heart Cath and Coronary Angiography;  Surgeon: Leonie Man, MD;  Location: [facility];  Service: Cardiovascular;  Laterality: N/A;  . LOOP RECORDER IMPLANT      Current Medications: Current Meds  Medication Sig  . aspirin EC 81 MG tablet Take 81 mg by mouth daily.  . diphenhydrAMINE (BENADRYL) 25 MG tablet Take 25 mg by mouth at bedtime as needed for sleep.  Marland Kitchen ibuprofen (ADVIL,MOTRIN) 200 MG tablet Take 400 mg by mouth every 8 (eight) hours as needed (for pain.).  Marland Kitchen lisinopril (PRINIVIL,ZESTRIL) 5 MG tablet Take 5 mg by mouth daily.  . metoprolol tartrate (LOPRESSOR) 25 MG tablet Take 25 mg by mouth 2 (two) times daily.  . Multiple Vitamin  (MULTIVITAMIN WITH MINERALS) TABS tablet Take 1 tablet by mouth daily.  . nitroGLYCERIN (NITROSTAT) 0.4 MG SL tablet Place 1 tablet (0.4 mg total) under the tongue every 5 (five) minutes as needed for chest pain.  . Omega-3 Fatty Acids (FISH OIL) 1000 MG CAPS Take 1,000 mg by mouth 2 (two) times daily.   . pantoprazole (PROTONIX) 40 MG tablet Take 40 mg by mouth daily.  . ranolazine (RANEXA) 500 MG 12 hr tablet Take 1 tablet (500 mg total) by mouth 2 (two) times daily.  . rosuvastatin (CRESTOR) 20 MG tablet Take 1 tablet (20 mg total) by mouth daily.  . [DISCONTINUED] rosuvastatin (CRESTOR) 20 MG tablet Take 1 tablet (20 mg total) by mouth daily.     Allergies:   Patient has no known allergies.   Social History   Social History  . Marital status: Married    Spouse name: N/A  . Number of children: N/A  . Years of education: N/A   Social History Main Topics  . Smoking status: Never Smoker  . Smokeless tobacco: Never Used  . Alcohol use No  . Drug use: No  . Sexual activity: Not Asked   Other Topics Concern  . None   Social History Narrative  . None     Family History: The patient's family history includes Alzheimer's disease in his father; Blindness in his father; Glaucoma in his father;  Heart disease in his mother; Hypertension in his father and mother. ROS:   Please see the history of present illness.    All 14 point review of systems negative except as described per history of present illness  EKGs/Labs/Other Studies Reviewed:      Recent Labs: 09/09/2016: ALT 18; Hemoglobin 13.9; Platelets 190 10/04/2016: BUN 20; Creatinine, Ser 1.08; NT-Pro BNP 72; Potassium 5.2; Sodium 142  Recent Lipid Panel No results found for: CHOL, TRIG, HDL, CHOLHDL, VLDL, LDLCALC, LDLDIRECT  Physical Exam:    VS:  BP (!) 120/58   Pulse 73   Ht 6' (1.829 m)   Wt 186 lb (84.4 kg)   SpO2 98%   BMI 25.23 kg/m     Wt Readings from Last 3 Encounters:  11/09/16 186 lb (84.4 kg)  10/04/16  186 lb 6.4 oz (84.6 kg)  09/13/16 185 lb (83.9 kg)     GEN:  Well nourished, well developed in no acute distress HEENT: Normal NECK: No JVD; No carotid bruits LYMPHATICS: No lymphadenopathy CARDIAC: RRR, no murmurs, no rubs, no gallops RESPIRATORY:  Clear to auscultation without rales, wheezing or rhonchi  ABDOMEN: Soft, non-tender, non-distended MUSCULOSKELETAL:  No edema; No deformity  SKIN: Warm and dry LOWER EXTREMITIES: no swelling NEUROLOGIC:  Alert and oriented x 3 PSYCHIATRIC:  Normal affect   ASSESSMENT:    1. Syncope, unspecified syncope type   2. VT (ventricular tachycardia) ([place])   3. Angina pectoris ([place])   4. Dizziness   5. CAD S/P percutaneous coronary angioplasty   6. Coronary artery disease involving native coronary artery of native heart without angina pectoris   7. Essential hypertension    PLAN:    In order of problems listed above:  1. History of ventricle tachycardia: Now he is talking about dizziness IWe will ask him to were event recorder 2. Coronary artery disease: Cardiac catheterization reviewed again nonobstructive disease. 3. Essential hypertension: Blood pressure well-controlled. 4. Dizziness: Event recorder will be placed.   Medication Adjustments/Labs and Tests Ordered: Current medicines are reviewed at length with the patient today.  Concerns regarding medicines are outlined above.  Orders Placed This Encounter  Procedures  . CARDIAC EVENT MONITOR   Medication changes:  Meds ordered this encounter  Medications  . rosuvastatin (CRESTOR) 20 MG tablet    Sig: Take 1 tablet (20 mg total) by mouth daily.    Dispense:  90 tablet    Refill:  3    Signed, Park Liter, MD, [facility] 11/09/2016 3:50 PM    [place]

## 2016-11-09 NOTE — Patient Instructions (Addendum)
Medication Instructions:  Your physician recommends that you continue on your current medications as directed. Please refer to the Current Medication list given to you today.   Labwork: None ordered  Testing/Procedures: Your physician has recommended that you wear a holter monitor. Holter monitors are medical devices that record the heart's electrical activity. Doctors most often use these monitors to diagnose arrhythmias. Arrhythmias are problems with the speed or rhythm of the heartbeat. The monitor is a small, portable device. You can wear one while you do your normal daily activities. This is usually used to diagnose what is causing palpitations/syncope (passing out).   Follow-Up: Your physician recommends that you schedule a follow-up appointment in: 6 week with Dr. Agustin Cree   Any Other Special Instructions Will Be Listed Below (If Applicable).  Please note that any paperwork needing to be filled out by the provider will need to be addressed at the front desk prior to seeing the provider. Please note that any paperwork FMLA, Disability or other documents regarding health condition is subject to a $25.00 charge that must be received prior to completion of paperwork in the form of a money order or check.    If you need a refill on your cardiac medications before your next appointment, please call your pharmacy.

## 2016-11-14 ENCOUNTER — Ambulatory Visit: Payer: Medicare Other

## 2016-11-14 DIAGNOSIS — R55 Syncope and collapse: Secondary | ICD-10-CM

## 2016-11-14 DIAGNOSIS — I472 Ventricular tachycardia, unspecified: Secondary | ICD-10-CM

## 2016-11-14 DIAGNOSIS — I209 Angina pectoris, unspecified: Secondary | ICD-10-CM

## 2016-12-21 ENCOUNTER — Ambulatory Visit: Payer: Medicare Other | Admitting: Cardiology

## 2016-12-21 ENCOUNTER — Encounter: Payer: Self-pay | Admitting: Cardiology

## 2016-12-21 VITALS — BP 132/68 | HR 84 | Resp 10 | Ht 72.0 in | Wt 184.0 lb

## 2016-12-21 DIAGNOSIS — I2510 Atherosclerotic heart disease of native coronary artery without angina pectoris: Secondary | ICD-10-CM | POA: Diagnosis not present

## 2016-12-21 DIAGNOSIS — I10 Essential (primary) hypertension: Secondary | ICD-10-CM | POA: Diagnosis not present

## 2016-12-21 DIAGNOSIS — R55 Syncope and collapse: Secondary | ICD-10-CM | POA: Diagnosis not present

## 2016-12-21 NOTE — Patient Instructions (Addendum)
Medication Instructions:  Your physician recommends that you continue on your current medications as directed. Please refer to the Current Medication list given to you today.  Labwork: None   Testing/Procedures: None   Follow-Up: Your physician wants you to follow-up in: 5 months. You will receive a reminder letter in the mail two months in advance. If you don't receive a letter, please call our office to schedule the follow-up appointment.  Any Other Special Instructions Will Be Listed Below (If Applicable).  Please note that any paperwork needing to be filled out by the provider will need to be addressed at the front desk prior to seeing the provider. Please note that any paperwork FMLA, Disability or other documents regarding health condition is subject to a $25.00 charge that must be received prior to completion of paperwork in the form of a money order or check.     If you need a refill on your cardiac medications before your next appointment, please call your pharmacy.

## 2016-12-21 NOTE — Progress Notes (Signed)
Cardiology Office Note:    Date:  12/21/2016   ID:  Scott Whitehead, DOB [birth year], MRN [id number]  PCP:  Myer Peer, MD  Cardiologist:  Jenne Campus, MD    Referring MD: Myer Peer, MD   Chief Complaint  Patient presents with  . 6 week follow up  Doing well but still have some dizziness  History of Present Illness:    Scott Whitehead is a 76 y.o. male with coronary artery disease.  He does have some dizziness it happens especially when he worked hard.  I put a event recorder on him and he did have few episode of dizziness while wearing event recorder.  Event recorder showed only some PVCs and APCs no sustained arrhythmia no worrisome arrhythmia.  I advised him to stay well-hydrated and let me know if he passed out although dizziness will get worse.  I did look at his cardiac catheterization again which showed only nonobstructive lesions  Past Medical History:  Diagnosis Date  . Cancer ([place])   . GERD (gastroesophageal reflux disease)   . Hyperlipidemia   . Hypertension     Past Surgical History:  Procedure Laterality Date  . CARDIAC SURGERY     heart monitor is placed in the heart area  . LOOP RECORDER IMPLANT      Current Medications: Current Meds  Medication Sig  . aspirin EC 81 MG tablet Take 81 mg by mouth daily.  . diphenhydrAMINE (BENADRYL) 25 MG tablet Take 25 mg by mouth at bedtime as needed for sleep.  Marland Kitchen ibuprofen (ADVIL,MOTRIN) 200 MG tablet Take 400 mg by mouth every 8 (eight) hours as needed (for pain.).  Marland Kitchen lisinopril (PRINIVIL,ZESTRIL) 5 MG tablet Take 5 mg by mouth daily.  . metoprolol tartrate (LOPRESSOR) 25 MG tablet Take 25 mg by mouth 2 (two) times daily.  . Multiple Vitamin (MULTIVITAMIN WITH MINERALS) TABS tablet Take 1 tablet by mouth daily.  . nitroGLYCERIN (NITROSTAT) 0.4 MG SL tablet Place 1 tablet (0.4 mg total) under the tongue every 5 (five) minutes as needed for chest pain.  . Omega-3 Fatty Acids (FISH OIL) 1000 MG CAPS Take 1,000  mg by mouth 2 (two) times daily.   . pantoprazole (PROTONIX) 40 MG tablet Take 40 mg by mouth daily.  . rosuvastatin (CRESTOR) 20 MG tablet Take 1 tablet (20 mg total) by mouth daily.     Allergies:   Patient has no known allergies.   Social History   Socioeconomic History  . Marital status: Married    Spouse name: None  . Number of children: None  . Years of education: None  . Highest education level: None  Social Needs  . Financial resource strain: None  . Food insecurity - worry: None  . Food insecurity - inability: None  . Transportation needs - medical: None  . Transportation needs - non-medical: None  Occupational History  . None  Tobacco Use  . Smoking status: Never Smoker  . Smokeless tobacco: Never Used  Substance and Sexual Activity  . Alcohol use: No  . Drug use: No  . Sexual activity: None  Other Topics Concern  . None  Social History Narrative  . None     Family History: The patient's family history includes Alzheimer's disease in his father; Blindness in his father; Glaucoma in his father; Heart disease in his mother; Hypertension in his father and mother. ROS:   Please see the history of present illness.    All 14 point review of  systems negative except as described per history of present illness  EKGs/Labs/Other Studies Reviewed:      Recent Labs: 09/09/2016: ALT 18; Hemoglobin 13.9; Platelets 190 10/04/2016: BUN 20; Creatinine, Ser 1.08; NT-Pro BNP 72; Potassium 5.2; Sodium 142  Recent Lipid Panel No results found for: CHOL, TRIG, HDL, CHOLHDL, VLDL, LDLCALC, LDLDIRECT  Physical Exam:    VS:  BP 132/68   Pulse 84   Resp 10   Ht 6' (1.829 m)   Wt 184 lb (83.5 kg)   BMI 24.95 kg/m     Wt Readings from Last 3 Encounters:  12/21/16 184 lb (83.5 kg)  11/09/16 186 lb (84.4 kg)  10/04/16 186 lb 6.4 oz (84.6 kg)     GEN:  Well nourished, well developed in no acute distress HEENT: Normal NECK: No JVD; No carotid bruits LYMPHATICS: No  lymphadenopathy CARDIAC: RRR, no murmurs, no rubs, no gallops RESPIRATORY:  Clear to auscultation without rales, wheezing or rhonchi  ABDOMEN: Soft, non-tender, non-distended MUSCULOSKELETAL:  No edema; No deformity  SKIN: Warm and dry LOWER EXTREMITIES: no swelling NEUROLOGIC:  Alert and oriented x 3 PSYCHIATRIC:  Normal affect   ASSESSMENT:    1. Coronary artery disease involving native coronary artery of native heart without angina pectoris   2. Essential hypertension   3. Syncope, unspecified syncope type    PLAN:    In order of problems listed above:  1. Coronary artery disease: Stable without symptoms continue present management. 2. Essential hypertension doing well from that point of view we will continue present management. 3. Syncope   Medication Adjustments/Labs and Tests Ordered: Current medicines are reviewed at length with the patient today.  Concerns regarding medicines are outlined above.  No orders of the defined types were placed in this encounter.  Medication changes: No orders of the defined types were placed in this encounter.   Signed, Park Liter, MD, [facility] 12/21/2016 10:42 AM    [place]

## 2017-04-10 DIAGNOSIS — R05 Cough: Secondary | ICD-10-CM | POA: Diagnosis not present

## 2017-04-10 DIAGNOSIS — Z125 Encounter for screening for malignant neoplasm of prostate: Secondary | ICD-10-CM | POA: Diagnosis not present

## 2017-04-10 DIAGNOSIS — E782 Mixed hyperlipidemia: Secondary | ICD-10-CM | POA: Diagnosis not present

## 2017-04-10 DIAGNOSIS — Z79899 Other long term (current) drug therapy: Secondary | ICD-10-CM | POA: Diagnosis not present

## 2017-04-10 DIAGNOSIS — Z0000 Encounter for general adult medical examination without abnormal findings: Secondary | ICD-10-CM | POA: Diagnosis not present

## 2017-04-10 DIAGNOSIS — M549 Dorsalgia, unspecified: Secondary | ICD-10-CM | POA: Diagnosis not present

## 2017-04-13 DIAGNOSIS — D492 Neoplasm of unspecified behavior of bone, soft tissue, and skin: Secondary | ICD-10-CM | POA: Diagnosis not present

## 2017-04-13 DIAGNOSIS — D2361 Other benign neoplasm of skin of right upper limb, including shoulder: Secondary | ICD-10-CM | POA: Diagnosis not present

## 2017-04-13 DIAGNOSIS — D225 Melanocytic nevi of trunk: Secondary | ICD-10-CM | POA: Diagnosis not present

## 2017-04-17 DIAGNOSIS — Z1211 Encounter for screening for malignant neoplasm of colon: Secondary | ICD-10-CM | POA: Diagnosis not present

## 2017-04-26 DIAGNOSIS — D492 Neoplasm of unspecified behavior of bone, soft tissue, and skin: Secondary | ICD-10-CM | POA: Diagnosis not present

## 2017-04-26 DIAGNOSIS — D235 Other benign neoplasm of skin of trunk: Secondary | ICD-10-CM | POA: Diagnosis not present

## 2017-04-26 DIAGNOSIS — D225 Melanocytic nevi of trunk: Secondary | ICD-10-CM | POA: Diagnosis not present

## 2017-05-05 DIAGNOSIS — D235 Other benign neoplasm of skin of trunk: Secondary | ICD-10-CM | POA: Diagnosis not present

## 2017-05-05 DIAGNOSIS — Z4802 Encounter for removal of sutures: Secondary | ICD-10-CM | POA: Diagnosis not present

## 2017-06-02 ENCOUNTER — Ambulatory Visit: Payer: Medicare Other | Admitting: Cardiology

## 2017-06-02 ENCOUNTER — Encounter: Payer: Self-pay | Admitting: Cardiology

## 2017-06-02 VITALS — BP 112/68 | HR 63 | Ht 72.0 in | Wt 187.0 lb

## 2017-06-02 DIAGNOSIS — Z95818 Presence of other cardiac implants and grafts: Secondary | ICD-10-CM | POA: Insufficient documentation

## 2017-06-02 DIAGNOSIS — I2510 Atherosclerotic heart disease of native coronary artery without angina pectoris: Secondary | ICD-10-CM

## 2017-06-02 DIAGNOSIS — R42 Dizziness and giddiness: Secondary | ICD-10-CM | POA: Diagnosis not present

## 2017-06-02 DIAGNOSIS — Z9889 Other specified postprocedural states: Secondary | ICD-10-CM

## 2017-06-02 DIAGNOSIS — Z9861 Coronary angioplasty status: Secondary | ICD-10-CM | POA: Diagnosis not present

## 2017-06-02 DIAGNOSIS — E785 Hyperlipidemia, unspecified: Secondary | ICD-10-CM | POA: Diagnosis not present

## 2017-06-02 DIAGNOSIS — Z8679 Personal history of other diseases of the circulatory system: Secondary | ICD-10-CM | POA: Diagnosis not present

## 2017-06-02 HISTORY — DX: Presence of other cardiac implants and grafts: Z95.818

## 2017-06-02 NOTE — Progress Notes (Signed)
Cardiology Office Note:    Date:  06/02/2017   ID:  Camauri Craton, DOB 03/15/1940, MRN [id number]  PCP:  Myer Peer, MD  Cardiologist:  Jenne Campus, MD    Referring MD: Myer Peer, MD   Chief Complaint  Patient presents with  . Follow-up  Doing well cardiac wise  History of Present Illness:    Scott Whitehead is a 77 y.o. male still very active denies have any chest pain tightness squeezing pressure burning chest work in the garden a lot complaining of having dizziness this dizziness most of the time happens when he gets up quickly also described to have some dizziness while walking.  Quite extensive evaluation has been performed so far regarding that history of so far no reasoning for the symptoms.  Past Medical History:  Diagnosis Date  . Cancer ([place])   . GERD (gastroesophageal reflux disease)   . Hyperlipidemia   . Hypertension     Past Surgical History:  Procedure Laterality Date  . CARDIAC SURGERY     heart monitor is placed in the heart area  . LEFT HEART CATH AND CORONARY ANGIOGRAPHY N/A 09/13/2016   Procedure: Left Heart Cath and Coronary Angiography;  Surgeon: Leonie Man, MD;  Location: [facility];  Service: Cardiovascular;  Laterality: N/A;  . LOOP RECORDER IMPLANT      Current Medications: Current Meds  Medication Sig  . aspirin EC 81 MG tablet Take 81 mg by mouth daily.  . diphenhydrAMINE (BENADRYL) 25 MG tablet Take 25 mg by mouth at bedtime as needed for sleep.  Marland Kitchen ibuprofen (ADVIL,MOTRIN) 200 MG tablet Take 400 mg by mouth every 8 (eight) hours as needed (for pain.).  Marland Kitchen lisinopril (PRINIVIL,ZESTRIL) 5 MG tablet Take 5 mg by mouth daily.  Marland Kitchen MAGNESIUM PO Take by mouth daily.  . metoprolol tartrate (LOPRESSOR) 25 MG tablet Take 25 mg by mouth 2 (two) times daily.  . Multiple Vitamin (MULTIVITAMIN WITH MINERALS) TABS tablet Take 1 tablet by mouth daily.  . nitroGLYCERIN (NITROSTAT) 0.4 MG SL tablet Place 1 tablet (0.4 mg total)  under the tongue every 5 (five) minutes as needed for chest pain.  . Omega-3 Fatty Acids (FISH OIL) 1000 MG CAPS Take 1,000 mg by mouth 2 (two) times daily.   . pantoprazole (PROTONIX) 40 MG tablet Take 40 mg by mouth daily.  . rosuvastatin (CRESTOR) 20 MG tablet Take 1 tablet (20 mg total) by mouth daily.     Allergies:   Patient has no known allergies.   Social History   Socioeconomic History  . Marital status: Married    Spouse name: Not on file  . Number of children: Not on file  . Years of education: Not on file  . Highest education level: Not on file  Occupational History  . Not on file  Social Needs  . Financial resource strain: Not on file  . Food insecurity:    Worry: Not on file    Inability: Not on file  . Transportation needs:    Medical: Not on file    Non-medical: Not on file  Tobacco Use  . Smoking status: Never Smoker  . Smokeless tobacco: Never Used  Substance and Sexual Activity  . Alcohol use: No  . Drug use: No  . Sexual activity: Not on file  Lifestyle  . Physical activity:    Days per week: Not on file    Minutes per session: Not on file  . Stress: Not on  file  Relationships  . Social connections:    Talks on phone: Not on file    Gets together: Not on file    Attends religious service: Not on file    Active member of club or organization: Not on file    Attends meetings of clubs or organizations: Not on file    Relationship status: Not on file  Other Topics Concern  . Not on file  Social History Narrative  . Not on file     Family History: The patient's family history includes Alzheimer's disease in his father; Blindness in his father; Glaucoma in his father; Heart disease in his mother; Hypertension in his father and mother. ROS:   Please see the history of present illness.    All 14 point review of systems negative except as described per history of present illness  EKGs/Labs/Other Studies Reviewed:      Recent Labs: 09/09/2016:  ALT 18; Hemoglobin 13.9; Platelets 190 10/04/2016: BUN 20; Creatinine, Ser 1.08; NT-Pro BNP 72; Potassium 5.2; Sodium 142  Recent Lipid Panel No results found for: CHOL, TRIG, HDL, CHOLHDL, VLDL, LDLCALC, LDLDIRECT  Physical Exam:    VS:  BP 112/68 (BP Location: Right Arm, Patient Position: Sitting, Cuff Size: Normal)   Pulse 63   Ht 6' (1.829 m)   Wt 187 lb (84.8 kg)   SpO2 98%   BMI 25.36 kg/m     Wt Readings from Last 3 Encounters:  06/02/17 187 lb (84.8 kg)  12/21/16 184 lb (83.5 kg)  11/09/16 186 lb (84.4 kg)     GEN:  Well nourished, well developed in no acute distress HEENT: Normal NECK: No JVD; No carotid bruits LYMPHATICS: No lymphadenopathy CARDIAC: RRR, no murmurs, no rubs, no gallops RESPIRATORY:  Clear to auscultation without rales, wheezing or rhonchi  ABDOMEN: Soft, non-tender, non-distended MUSCULOSKELETAL:  No edema; No deformity  SKIN: Warm and dry LOWER EXTREMITIES: no swelling NEUROLOGIC:  Alert and oriented x 3 PSYCHIATRIC:  Normal affect   ASSESSMENT:    1. CAD S/P percutaneous coronary angioplasty   2. Coronary artery disease involving native coronary artery of native heart without angina pectoris   3. Dizziness   4. Hyperlipidemia with target low density lipoprotein (LDL) cholesterol less than 70 mg/dL   5. Status post ablation of atrial flutter    PLAN:    In order of problems listed above:  1. Coronary artery disease stable last cardiac catheterization review showing no obstructive lesion.  On appropriate medications which I will continue 2. Dizziness appears to be orthostatic I advised him to make sure he drink plenty of water and let me know if this became worse. 3. Hyperlipidemia: Followed by internal medicine team will call the office to get a copy of report 4. Status post atrial flutter ablation no more palpitations stable  See him back in my office 6 months sooner if he get a problem   Medication Adjustments/Labs and Tests  Ordered: Current medicines are reviewed at length with the patient today.  Concerns regarding medicines are outlined above.  No orders of the defined types were placed in this encounter.  Medication changes: No orders of the defined types were placed in this encounter.   Signed, Park Liter, MD, [facility] 06/02/2017 12:05 PM    [place]

## 2017-06-02 NOTE — Patient Instructions (Signed)

## 2017-06-05 ENCOUNTER — Encounter: Payer: Self-pay | Admitting: *Deleted

## 2017-06-30 ENCOUNTER — Other Ambulatory Visit: Payer: Self-pay

## 2017-06-30 ENCOUNTER — Telehealth: Payer: Self-pay | Admitting: Cardiology

## 2017-06-30 MED ORDER — METOPROLOL TARTRATE 25 MG PO TABS: 25.0000 mg | ORAL_TABLET | Freq: Two times a day (BID) | ORAL | 2 refills | Status: DC

## 2017-06-30 NOTE — Telephone Encounter (Signed)
Need refill of metoprolol 25mg  twice a day sent to [facility] order please

## 2017-06-30 NOTE — Telephone Encounter (Signed)
Med refill has been sent. 

## 2017-07-14 DIAGNOSIS — M7989 Other specified soft tissue disorders: Secondary | ICD-10-CM | POA: Diagnosis not present

## 2017-07-14 DIAGNOSIS — M79662 Pain in left lower leg: Secondary | ICD-10-CM | POA: Diagnosis not present

## 2017-07-14 DIAGNOSIS — S7010XA Contusion of unspecified thigh, initial encounter: Secondary | ICD-10-CM | POA: Diagnosis not present

## 2017-07-18 DIAGNOSIS — M7989 Other specified soft tissue disorders: Secondary | ICD-10-CM | POA: Diagnosis not present

## 2017-07-18 DIAGNOSIS — M79662 Pain in left lower leg: Secondary | ICD-10-CM | POA: Diagnosis not present

## 2017-09-11 ENCOUNTER — Other Ambulatory Visit: Payer: Self-pay | Admitting: Cardiology

## 2017-09-20 DIAGNOSIS — H524 Presbyopia: Secondary | ICD-10-CM | POA: Diagnosis not present

## 2017-09-20 DIAGNOSIS — H2513 Age-related nuclear cataract, bilateral: Secondary | ICD-10-CM | POA: Diagnosis not present

## 2017-11-20 DIAGNOSIS — I8390 Asymptomatic varicose veins of unspecified lower extremity: Secondary | ICD-10-CM | POA: Diagnosis not present

## 2017-11-20 DIAGNOSIS — Z23 Encounter for immunization: Secondary | ICD-10-CM | POA: Diagnosis not present

## 2017-11-20 DIAGNOSIS — L570 Actinic keratosis: Secondary | ICD-10-CM | POA: Diagnosis not present

## 2017-11-27 ENCOUNTER — Other Ambulatory Visit: Payer: Self-pay

## 2017-11-27 DIAGNOSIS — I83893 Varicose veins of bilateral lower extremities with other complications: Secondary | ICD-10-CM

## 2017-12-29 ENCOUNTER — Encounter: Payer: Self-pay | Admitting: Cardiology

## 2017-12-29 ENCOUNTER — Ambulatory Visit (INDEPENDENT_AMBULATORY_CARE_PROVIDER_SITE_OTHER): Payer: Medicare Other | Admitting: Cardiology

## 2017-12-29 VITALS — BP 122/64 | HR 69 | Ht 72.0 in | Wt 189.8 lb

## 2017-12-29 DIAGNOSIS — I2510 Atherosclerotic heart disease of native coronary artery without angina pectoris: Secondary | ICD-10-CM

## 2017-12-29 DIAGNOSIS — Z9861 Coronary angioplasty status: Secondary | ICD-10-CM

## 2017-12-29 DIAGNOSIS — I472 Ventricular tachycardia, unspecified: Secondary | ICD-10-CM

## 2017-12-29 DIAGNOSIS — E785 Hyperlipidemia, unspecified: Secondary | ICD-10-CM | POA: Diagnosis not present

## 2017-12-29 DIAGNOSIS — Z95818 Presence of other cardiac implants and grafts: Secondary | ICD-10-CM | POA: Diagnosis not present

## 2017-12-29 DIAGNOSIS — Z9889 Other specified postprocedural states: Secondary | ICD-10-CM

## 2017-12-29 DIAGNOSIS — Z8679 Personal history of other diseases of the circulatory system: Secondary | ICD-10-CM

## 2017-12-29 NOTE — Progress Notes (Signed)
Cardiology Office Note:    Date:  12/29/2017   ID:  Scott Whitehead, DOB 06/07/1940, MRN [id number]  PCP:  Myer Peer, MD  Cardiologist:  Jenne Campus, MD    Referring MD: Myer Peer, MD   Chief Complaint  Patient presents with  . Follow-up  Doing well  History of Present Illness:    Scott Whitehead is a 77 y.o. male with past medical history significant for coronary artery disease status post PCI and stenting to LAD many years ago.  Also history of atrial flutter ablation.  Doing well denies have any palpitations no passing out no tightness squeezing pressure burning chest.  He is very active in the garden has no difficulty doing okay.  Past Medical History:  Diagnosis Date  . Cancer ([place])   . GERD (gastroesophageal reflux disease)   . Hyperlipidemia   . Hypertension     Past Surgical History:  Procedure Laterality Date  . CARDIAC SURGERY     heart monitor is placed in the heart area  . LEFT HEART CATH AND CORONARY ANGIOGRAPHY N/A 09/13/2016   Procedure: Left Heart Cath and Coronary Angiography;  Surgeon: Leonie Man, MD;  Location: [facility];  Service: Cardiovascular;  Laterality: N/A;  . LOOP RECORDER IMPLANT      Current Medications: Current Meds  Medication Sig  . aspirin EC 81 MG tablet Take 81 mg by mouth daily.  . diphenhydrAMINE (BENADRYL) 25 MG tablet Take 25 mg by mouth at bedtime as needed for sleep.  Marland Kitchen ibuprofen (ADVIL,MOTRIN) 200 MG tablet Take 400 mg by mouth every 8 (eight) hours as needed (for pain.).  Marland Kitchen lisinopril (PRINIVIL,ZESTRIL) 5 MG tablet Take 5 mg by mouth daily.  Marland Kitchen MAGNESIUM PO Take by mouth daily.  . metoprolol tartrate (LOPRESSOR) 25 MG tablet Take 1 tablet (25 mg total) by mouth 2 (two) times daily.  . Multiple Vitamin (MULTIVITAMIN WITH MINERALS) TABS tablet Take 1 tablet by mouth daily.  . nitroGLYCERIN (NITROSTAT) 0.4 MG SL tablet Place 1 tablet (0.4 mg total) under the tongue every 5 (five) minutes as needed  for chest pain.  . Omega-3 Fatty Acids (FISH OIL) 1000 MG CAPS Take 1,000 mg by mouth 2 (two) times daily.   . pantoprazole (PROTONIX) 40 MG tablet Take 40 mg by mouth daily.  . rosuvastatin (CRESTOR) 20 MG tablet TAKE 1 TABLET BY MOUTH  DAILY  . tamsulosin (FLOMAX) 0.4 MG CAPS capsule Take 0.4 mg by mouth daily after supper.     Allergies:   Patient has no known allergies.   Social History   Socioeconomic History  . Marital status: Married    Spouse name: Not on file  . Number of children: Not on file  . Years of education: Not on file  . Highest education level: Not on file  Occupational History  . Not on file  Social Needs  . Financial resource strain: Not on file  . Food insecurity:    Worry: Not on file    Inability: Not on file  . Transportation needs:    Medical: Not on file    Non-medical: Not on file  Tobacco Use  . Smoking status: Never Smoker  . Smokeless tobacco: Never Used  Substance and Sexual Activity  . Alcohol use: No  . Drug use: No  . Sexual activity: Not on file  Lifestyle  . Physical activity:    Days per week: Not on file    Minutes per session: Not  on file  . Stress: Not on file  Relationships  . Social connections:    Talks on phone: Not on file    Gets together: Not on file    Attends religious service: Not on file    Active member of club or organization: Not on file    Attends meetings of clubs or organizations: Not on file    Relationship status: Not on file  Other Topics Concern  . Not on file  Social History Narrative  . Not on file     Family History: The patient's family history includes Alzheimer's disease in his father; Blindness in his father; Glaucoma in his father; Heart disease in his mother; Hypertension in his father and mother. ROS:   Please see the history of present illness.    All 14 point review of systems negative except as described per history of present illness  EKGs/Labs/Other Studies Reviewed:       Recent Labs: No results found for requested labs within last 8760 hours.  Recent Lipid Panel No results found for: CHOL, TRIG, HDL, CHOLHDL, VLDL, LDLCALC, LDLDIRECT  Physical Exam:    VS:  BP 122/64   Pulse 69   Ht 6' (1.829 m)   Wt 189 lb 12.8 oz (86.1 kg)   SpO2 96%   BMI 25.74 kg/m     Wt Readings from Last 3 Encounters:  12/29/17 189 lb 12.8 oz (86.1 kg)  06/02/17 187 lb (84.8 kg)  12/21/16 184 lb (83.5 kg)     GEN:  Well nourished, well developed in no acute distress HEENT: Normal NECK: No JVD; No carotid bruits LYMPHATICS: No lymphadenopathy CARDIAC: RRR, no murmurs, no rubs, no gallops RESPIRATORY:  Clear to auscultation without rales, wheezing or rhonchi  ABDOMEN: Soft, non-tender, non-distended MUSCULOSKELETAL:  No edema; No deformity  SKIN: Warm and dry LOWER EXTREMITIES: no swelling NEUROLOGIC:  Alert and oriented x 3 PSYCHIATRIC:  Normal affect   ASSESSMENT:    1. CAD S/P percutaneous coronary angioplasty   2. VT (ventricular tachycardia) (HCC)   3. Status post ablation of atrial flutter   4. Status post placement of implantable loop recorder   5. Hyperlipidemia with target low density lipoprotein (LDL) cholesterol less than 70 mg/dL    PLAN:    In order of problems listed above:  1. Status post PTCA and angioplasty of LAD many years ago asymptomatic very active.  When he had trouble with his heart he had clear-cut symptoms now he does not have any. 2. History of ventricular tachycardia quite extensive evaluation has been done which included EP study done by Dr. Gerilyn Pilgrim.  He was not inducible.  He had a loop recorder put in many years ago did not show any arrhythmia. 3. History of atrial flutter status post ablation no arrhythmia since then.  Doing well overall no indications for anticoagulation since atrial flutter has been taking care of by ablation. 4. Status post loop recorder implantation this is an old reveal we were talking about  potentially removing it but he said he is fine with it he is not interested in removing this for now I told him if he would like to remove the device we can make arrangements for it. 5. Dyslipidemia I will ask him to have fasting lipid profile done.   Medication Adjustments/Labs and Tests Ordered: Current medicines are reviewed at length with the patient today.  Concerns regarding medicines are outlined above.  No orders of the defined types were placed in  this encounter.  Medication changes: No orders of the defined types were placed in this encounter.   Signed, Park Liter, MD, [facility] 12/29/2017 9:46 AM    [place]

## 2017-12-29 NOTE — Patient Instructions (Addendum)
Medication Instructions:  Your physician recommends that you continue on your current medications as directed. Please refer to the Current Medication list given to you today.  If you need a refill on your cardiac medications before your next appointment, please call your pharmacy.   Lab work: Your physician recommends that you return for lab work within one week: Lipids (fasting)   If you have labs (blood work) drawn today and your tests are completely normal, you will receive your results only by: Marland Kitchen MyChart Message (if you have MyChart) OR . A paper copy in the mail If you have any lab test that is abnormal or we need to change your treatment, we will call you to review the results.  Testing/Procedures: Your physician has requested that you have an echocardiogram. Echocardiography is a painless test that uses sound waves to create images of your heart. It provides your doctor with information about the size and shape of your heart and how well your heart's chambers and valves are working. This procedure takes approximately one hour. There are no restrictions for this procedure.    Follow-Up: At [facility], you and your health needs are our priority.  As part of our continuing mission to provide you with exceptional heart care, we have created designated Provider Care Teams.  These Care Teams include your primary Cardiologist (physician) and Advanced Practice Providers (APPs -  Physician Assistants and Nurse Practitioners) who all work together to provide you with the care you need, when you need it. You will need a follow up appointment in 6 months.  Please call our office 2 months in advance to schedule this appointment.  You may see No primary care provider on file. or another member of our [employer] Provider Team in [place]: Shirlee More, MD . Jyl Heinz, MD  Any Other Special Instructions Will Be Listed Below (If Applicable).  Echocardiogram An echocardiogram, or  echocardiography, uses sound waves (ultrasound) to produce an image of your heart. The echocardiogram is simple, painless, obtained within a short period of time, and offers valuable information to your health care provider. The images from an echocardiogram can provide information such as:  Evidence of coronary artery disease (CAD).  Heart size.  Heart muscle function.  Heart valve function.  Aneurysm detection.  Evidence of a past heart attack.  Fluid buildup around the heart.  Heart muscle thickening.  Assess heart valve function.  Tell a health care provider about:  Any allergies you have.  All medicines you are taking, including vitamins, herbs, eye drops, creams, and over-the-counter medicines.  Any problems you or family members have had with anesthetic medicines.  Any blood disorders you have.  Any surgeries you have had.  Any medical conditions you have.  Whether you are pregnant or may be pregnant. What happens before the procedure? No special preparation is needed. Eat and drink normally. What happens during the procedure?  In order to produce an image of your heart, gel will be applied to your chest and a wand-like tool (transducer) will be moved over your chest. The gel will help transmit the sound waves from the transducer. The sound waves will harmlessly bounce off your heart to allow the heart images to be captured in real-time motion. These images will then be recorded.  You may need an IV to receive a medicine that improves the quality of the pictures. What happens after the procedure? You may return to your normal schedule including diet, activities, and medicines, unless your  health care provider tells you otherwise. This information is not intended to replace advice given to you by your health care provider. Make sure you discuss any questions you have with your health care provider. Document Released: 01/29/2000 Document Revised: 09/19/2015 Document  Reviewed: 10/08/2012 Elsevier Interactive Patient Education  2017 [employer].

## 2017-12-29 NOTE — Addendum Note (Signed)
Addended by: Linna Hoff R on: 12/29/2017 10:01 AM   Modules accepted: Orders

## 2018-01-01 ENCOUNTER — Ambulatory Visit (INDEPENDENT_AMBULATORY_CARE_PROVIDER_SITE_OTHER): Payer: Medicare Other

## 2018-01-01 ENCOUNTER — Other Ambulatory Visit: Payer: Self-pay

## 2018-01-01 DIAGNOSIS — Z9889 Other specified postprocedural states: Secondary | ICD-10-CM

## 2018-01-01 DIAGNOSIS — Z8679 Personal history of other diseases of the circulatory system: Secondary | ICD-10-CM | POA: Diagnosis not present

## 2018-01-01 DIAGNOSIS — E785 Hyperlipidemia, unspecified: Secondary | ICD-10-CM | POA: Diagnosis not present

## 2018-01-01 DIAGNOSIS — I472 Ventricular tachycardia, unspecified: Secondary | ICD-10-CM

## 2018-01-01 LAB — LIPID PANEL
CHOLESTEROL TOTAL: 132 mg/dL (ref 100–199)
Chol/HDL Ratio: 3.5 ratio (ref 0.0–5.0)
HDL: 38 mg/dL — ABNORMAL LOW (ref >39)
LDL CALC: 70 mg/dL (ref 0–99)
TRIGLYCERIDES: 120 mg/dL (ref 0–149)
VLDL Cholesterol Cal: 24 mg/dL (ref 5–40)

## 2018-01-01 NOTE — Progress Notes (Signed)
Complete echocardiogram has been performed.  Jimmy Jed Kutch RDCS, RVT 

## 2018-01-08 ENCOUNTER — Other Ambulatory Visit: Payer: Self-pay | Admitting: Cardiology

## 2018-01-23 ENCOUNTER — Encounter: Payer: Self-pay | Admitting: Vascular Surgery

## 2018-01-23 ENCOUNTER — Encounter

## 2018-01-23 ENCOUNTER — Ambulatory Visit (HOSPITAL_COMMUNITY)
Admission: RE | Admit: 2018-01-23 | Discharge: 2018-01-23 | Disposition: A | Discharge status: Home / Self Care | Payer: Medicare Other | Source: Ambulatory Visit | Attending: Family Medicine | Admitting: Family Medicine

## 2018-01-23 ENCOUNTER — Ambulatory Visit: Payer: Medicare Other | Admitting: Vascular Surgery

## 2018-01-23 ENCOUNTER — Other Ambulatory Visit: Payer: Self-pay

## 2018-01-23 VITALS — BP 151/82 | HR 53 | Temp 97.4°F | Resp 16 | Ht 72.0 in | Wt 190.0 lb

## 2018-01-23 DIAGNOSIS — I83893 Varicose veins of bilateral lower extremities with other complications: Secondary | ICD-10-CM

## 2018-01-23 NOTE — Progress Notes (Signed)
HISTORY AND PHYSICAL     CC:  Pain in legs and feet Requesting Provider:  Myer Peer, MD  HPI: This is a 77 y.o. 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  He states about 6 months ago, he started getting an aching feeling in his RLE, especially after sitting for a while.  He states that he called and spoke with his PCP, Dr. Nicki Reaper back in August, who suggested he start wearing thigh high compression socks.  He has been wearing these every day since.  He has failed this conservative therapy.  He states that he does not have much swelling.  He does have pain in the right foot that he describes as stabbing and holding it up also makes it hurt.  He does have chronic color changes to the right lower leg.  He does not have problems with the left leg.    He takes a daily aspirin.  He is on an ACEI & beta blocker for blood pressure management.  The pt is on a statin for cholesterol management.  He has hx of coronary stents in the past.  He has never smoked.   Past Medical History:  Diagnosis Date  . Cancer ([place])   . GERD (gastroesophageal reflux disease)   . Hyperlipidemia   . Hypertension     Past Surgical History:  Procedure Laterality Date  . CARDIAC SURGERY     heart monitor is placed in the heart area  . LEFT HEART CATH AND CORONARY ANGIOGRAPHY N/A 09/13/2016   Procedure: Left Heart Cath and Coronary Angiography;  Surgeon: Leonie Man, MD;  Location: [facility];  Service: Cardiovascular;  Laterality: N/A;  . LOOP RECORDER IMPLANT      No Known Allergies  Current Outpatient Medications  Medication Sig Dispense Refill  . aspirin EC 81 MG tablet Take 81 mg by mouth daily.    . diphenhydrAMINE (BENADRYL) 25 MG tablet Take 25 mg by mouth at bedtime as needed for sleep.    Marland Kitchen ibuprofen (ADVIL,MOTRIN) 200 MG tablet Take 400 mg by mouth every 8 (eight) hours as needed (for pain.).    Marland Kitchen lisinopril (PRINIVIL,ZESTRIL) 5 MG tablet Take 5 mg by  mouth daily.    Marland Kitchen MAGNESIUM PO Take by mouth daily.    . metoprolol tartrate (LOPRESSOR) 25 MG tablet TAKE 1 TABLET BY MOUTH TWO  TIMES DAILY 180 tablet 1  . Multiple Vitamin (MULTIVITAMIN WITH MINERALS) TABS tablet Take 1 tablet by mouth daily.    . nitroGLYCERIN (NITROSTAT) 0.4 MG SL tablet Place 1 tablet (0.4 mg total) under the tongue every 5 (five) minutes as needed for chest pain. 25 tablet 11  . Omega-3 Fatty Acids (FISH OIL) 1000 MG CAPS Take 1,000 mg by mouth 2 (two) times daily.     . pantoprazole (PROTONIX) 40 MG tablet Take 40 mg by mouth daily.    . rosuvastatin (CRESTOR) 20 MG tablet TAKE 1 TABLET BY MOUTH  DAILY 90 tablet 2  . tamsulosin (FLOMAX) 0.4 MG CAPS capsule Take 0.4 mg by mouth daily after supper.     No current facility-administered medications for this visit.     Family History  Problem Relation Age of Onset  . Heart disease Mother   . Hypertension Mother   . Alzheimer's disease Father   . Glaucoma Father   . Hypertension Father   . Blindness Father     Social History   Socioeconomic  History  . Marital status: Married    Spouse name: Not on file  . Number of children: Not on file  . Years of education: Not on file  . Highest education level: Not on file  Occupational History  . Not on file  Social Needs  . Financial resource strain: Not on file  . Food insecurity:    Worry: Not on file    Inability: Not on file  . Transportation needs:    Medical: Not on file    Non-medical: Not on file  Tobacco Use  . Smoking status: Never Smoker  . Smokeless tobacco: Never Used  Substance and Sexual Activity  . Alcohol use: No  . Drug use: No  . Sexual activity: Not on file  Lifestyle  . Physical activity:    Days per week: Not on file    Minutes per session: Not on file  . Stress: Not on file  Relationships  . Social connections:    Talks on phone: Not on file    Gets together: Not on file    Attends religious service: Not on file    Active member  of club or organization: Not on file    Attends meetings of clubs or organizations: Not on file    Relationship status: Not on file  . Intimate partner violence:    Fear of current or ex partner: Not on file    Emotionally abused: Not on file    Physically abused: Not on file    Forced sexual activity: Not on file  Other Topics Concern  . Not on file  Social History Narrative  . Not on file     REVIEW OF SYSTEMS:   [X]  denotes positive finding, [ ]  denotes negative finding Cardiac  Comments:  Chest pain or chest pressure:    Shortness of breath upon exertion:    Short of breath when lying flat:    Irregular heart rhythm:        Vascular    Pain in calf, thigh, or hip brought on by ambulation:    Pain in feet at night that wakes you up from your sleep:  x   Blood clot in your veins:    Leg swelling:         Pulmonary    Oxygen at home:    Productive cough:     Wheezing:         Neurologic    Sudden weakness in arms or legs:     Sudden numbness in arms or legs:     Sudden onset of difficulty speaking or slurred speech:    Temporary loss of vision in one eye:     Problems with dizziness:         Gastrointestinal    Blood in stool:     Vomited blood:         Genitourinary    Burning when urinating:     Blood in urine:        Psychiatric    Major depression:         Hematologic    Bleeding problems:    Problems with blood clotting too easily:        Skin    Rashes or ulcers:        Constitutional    Fever or chills:      PHYSICAL EXAMINATION:  Today's Vitals   01/23/18 1421  BP: (!) 151/82  Pulse: (!) 53  Resp: 16  Temp: (!) 97.4 F (36.3 C)  TempSrc: Oral  Weight: 190 lb (86.2 kg)  Height: 6' (1.829 m)  PainSc: 6    Body mass index is 25.77 kg/m.  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   Non-Invasive Vascular Imaging:   Lower venous reflux study 01/23/18: Right:  Vein diameters:  0.430cm-1.24cm Left:  Vein diameters:  0.207cm-0.543cm  Right reflux: -sapheno-femoral junction is incompetent -GSV demonstrates reflux from the SFJ to calf -the SSV demonstrates reflux from the SPJ into the calf -the common femoral vein demonstrates reflux Left Reflux: -The sapheno-femoral junction is competent -the GSV demonstrates reflux at the knee -no evidence of SSV reflux -no evidence of deep venous reflux  Pt meds includes: Statin:  Yes.   Beta Blocker:  Yes.   Aspirin:  Yes.   ACEI:  Yes.   ARB:  No. 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 He has been wearing thigh high compression stockings since August as recommended by Dr. Nicki Reaper, his PCP.  He has failed conservative therapy.  pt has easily palpable pedal pulses -Dr. Donnetta Hutching has recommended laser ablation and stab phlebectomy (10-20 stabs).  Dr. Donnetta Hutching discussed with pt either Dr. Scot Dock or Dr. Oneida Alar would be performing the procedure.     Leontine Locket, PA-C [facility] [phone]  Clinic MD:  Pt seen and examined with Dr. Donnetta Hutching   I have examined the patient, reviewed and agree with above.  I image the patient's  veins with SonoSite ultrasound.  This confirms markedly enlarged saphenous vein extending directly into the large varicosities behind his popliteal space and extending down into his calf.  He is certainly having symptoms related to this and I have recommended laser ablation of his great saphenous vein and staged stab phlebectomy of tributary varicosities.  He has been wearing thigh-high graduated compression garments since August elevation when possible and continues to have difficulty.  We will schedule this at his earliest convenience  Curt Jews, MD 01/23/2018 4:35 PM

## 2018-01-24 DIAGNOSIS — L0390 Cellulitis, unspecified: Secondary | ICD-10-CM | POA: Diagnosis not present

## 2018-01-24 DIAGNOSIS — M5430 Sciatica, unspecified side: Secondary | ICD-10-CM | POA: Diagnosis not present

## 2018-02-16 ENCOUNTER — Other Ambulatory Visit: Payer: Self-pay | Admitting: *Deleted

## 2018-02-16 DIAGNOSIS — I83893 Varicose veins of bilateral lower extremities with other complications: Secondary | ICD-10-CM

## 2018-03-01 ENCOUNTER — Other Ambulatory Visit: Payer: Medicare Other | Admitting: Vascular Surgery

## 2018-03-08 ENCOUNTER — Ambulatory Visit: Payer: Medicare Other | Admitting: Vascular Surgery

## 2018-03-08 ENCOUNTER — Encounter: Payer: Self-pay | Admitting: Vascular Surgery

## 2018-03-08 ENCOUNTER — Encounter (HOSPITAL_COMMUNITY): Payer: Medicare Other

## 2018-03-08 VITALS — BP 120/66 | HR 65 | Temp 97.0°F | Resp 16 | Ht 72.0 in | Wt 190.0 lb

## 2018-03-08 DIAGNOSIS — I83893 Varicose veins of bilateral lower extremities with other complications: Secondary | ICD-10-CM

## 2018-03-08 HISTORY — PX: ENDOVENOUS ABLATION SAPHENOUS VEIN W/ LASER: SUR449

## 2018-03-08 NOTE — Progress Notes (Signed)
     Laser Ablation Procedure    Date: 03/08/2018   Scott Whitehead DOB:13-May-1940  Consent signed: Yes    Surgeon:  Dr. Deitra Mayo   Procedure: Laser Ablation: right Greater Saphenous Vein  BP 120/66 (BP Location: Left Arm, Patient Position: Sitting, Cuff Size: Normal)   Pulse 65   Temp (!) 97 F (36.1 C) (Oral)   Resp 16   Ht 6' (1.829 m)   Wt 190 lb (86.2 kg)   SpO2 98%   BMI 25.77 kg/m   Tumescent Anesthesia: 300 cc 0.9% NaCl with 50 cc Lidocaine HCL 1% and 15 cc 8.4% NaHCO3  Local Anesthesia: 5 cc Lidocaine HCL and NaHCO3 (ratio 2:1)  17 watts continuous mode        Total energy: 2414 Joules   Total time: 2:21   Patient tolerated procedure well    Description of Procedure:  After marking the course of the secondary varicosities, the patient was placed on the operating table in the supine position, and the right leg was prepped and draped in sterile fashion.   Local anesthetic was administered and under ultrasound guidance the saphenous vein was accessed with a micro needle and guide wire; then the mirco puncture sheath was placed.  A guide wire was inserted saphenofemoral junction , followed by a 5 french sheath.  The position of the sheath and then the laser fiber below the junction was confirmed using the ultrasound.  Tumescent anesthesia was administered along the course of the saphenous vein using ultrasound guidance. The patient was placed in Trendelenburg position and protective laser glasses were placed on patient and staff, and the laser was fired at 15 watts continuous mode advancing 1-34mm/second for a total of 2414 joules.     Steri strip was applied to the IV insertion site and ABD pads and thigh high compression stockings were applied.  Ace wrap bandages were applied over the right thigh and at the top of the saphenofemoral junction. Blood loss was less than 15 cc.  The patient ambulated out of the operating room having tolerated the procedure well.

## 2018-03-08 NOTE — Progress Notes (Signed)
Patient name: Scott Whitehead MRN: [id number] DOB: May 10, 1940 Sex: male  REASON FOR VISIT:   For endovenous laser ablation of the right great saphenous vein  HPI:   Scott Whitehead is a pleasant 78 y.o. male who was seen by Dr. Sherren Mocha Early on 01/23/2018 and set up for endovenous laser ablation of the right great saphenous vein.   Current Outpatient Medications  Medication Sig Dispense Refill  . aspirin EC 81 MG tablet Take 81 mg by mouth daily.    . diphenhydrAMINE (BENADRYL) 25 MG tablet Take 25 mg by mouth at bedtime as needed for sleep.    Marland Kitchen ibuprofen (ADVIL,MOTRIN) 200 MG tablet Take 400 mg by mouth every 8 (eight) hours as needed (for pain.).    Marland Kitchen lisinopril (PRINIVIL,ZESTRIL) 5 MG tablet Take 5 mg by mouth daily.    Marland Kitchen MAGNESIUM PO Take by mouth daily.    . metoprolol tartrate (LOPRESSOR) 25 MG tablet TAKE 1 TABLET BY MOUTH TWO  TIMES DAILY 180 tablet 1  . Multiple Vitamin (MULTIVITAMIN WITH MINERALS) TABS tablet Take 1 tablet by mouth daily.    . nitroGLYCERIN (NITROSTAT) 0.4 MG SL tablet Place 1 tablet (0.4 mg total) under the tongue every 5 (five) minutes as needed for chest pain. 25 tablet 11  . Omega-3 Fatty Acids (FISH OIL) 1000 MG CAPS Take 1,000 mg by mouth 2 (two) times daily.     . pantoprazole (PROTONIX) 40 MG tablet Take 40 mg by mouth daily.    . rosuvastatin (CRESTOR) 20 MG tablet TAKE 1 TABLET BY MOUTH  DAILY 90 tablet 2  . tamsulosin (FLOMAX) 0.4 MG CAPS capsule Take 0.4 mg by mouth daily after supper.     No current facility-administered medications for this visit.     PHYSICAL EXAM:   Vitals:   03/08/18 0845  BP: 120/66  Pulse: 65  Resp: 16  Temp: (!) 97 F (36.1 C)  TempSrc: Oral  SpO2: 98%  Weight: 190 lb (86.2 kg)  Height: 6' (1.829 m)   GENERAL: The patient is a well-nourished male, in no acute distress. The vital signs are documented above.  DATA:   No new data  MEDICAL ISSUES:   LASER ABLATION RIGHT GREAT SAPHENOUS VEIN: The patient was  brought to the exam room and placed in the supine position.  I looked at the right great saphenous vein with the SonoSite.  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  I therefore went just above the knee above this large branch.  The right leg had been prepped and draped in usual sterile fashion.  Under ultrasound guidance, after the skin was anesthetized, the right great saphenous vein was cannulated just above the knee with a micropuncture needle and a micropuncture sheath introduced over a wire.  I then advanced the J-wire through the micropuncture sheath up to the level of just below the saphenofemoral junction just distal to the superficial epigastric vein.  The sheath was then advanced over the wire.  The dilator and wire were then removed.  I then advanced the laser to the end of the sheath which was below the superficial epigastric vein.  The sheath was then slightly retracted.  After the skin was anesthetized tumescent anesthesia was administered under ultrasound guidance the entire length of the vein up to the saphenofemoral junction.  The patient  was then placed in Trendelenburg.  Given the large size of the vein I elected to use 17 W of energy.  I compress the saphenofemoral junction and then laser ablation was performed of the right great saphenous vein from just below the superficial epigastric vein to just above the knee.  A total of 2414 J of energy were used.  A pressure dressing was applied.  Patient tolerated the change well.  He will return in 1 week for follow-up duplex.  Deitra Mayo [facility] [phone]

## 2018-03-13 ENCOUNTER — Encounter: Payer: Self-pay | Admitting: Vascular Surgery

## 2018-03-22 ENCOUNTER — Ambulatory Visit (INDEPENDENT_AMBULATORY_CARE_PROVIDER_SITE_OTHER): Payer: Medicare Other | Admitting: Vascular Surgery

## 2018-03-22 ENCOUNTER — Other Ambulatory Visit: Payer: Self-pay

## 2018-03-22 ENCOUNTER — Ambulatory Visit (HOSPITAL_COMMUNITY)
Admission: RE | Admit: 2018-03-22 | Discharge: 2018-03-22 | Disposition: A | Discharge status: Home / Self Care | Payer: Medicare Other | Source: Ambulatory Visit | Attending: Vascular Surgery | Admitting: Vascular Surgery

## 2018-03-22 ENCOUNTER — Encounter: Payer: Self-pay | Admitting: Vascular Surgery

## 2018-03-22 VITALS — BP 107/68 | HR 88 | Temp 97.4°F | Resp 18 | Ht 72.0 in | Wt 190.0 lb

## 2018-03-22 DIAGNOSIS — I83893 Varicose veins of bilateral lower extremities with other complications: Secondary | ICD-10-CM

## 2018-03-22 NOTE — Progress Notes (Signed)
Patient name: Scott Whitehead MRN: [id number] DOB: Apr 18, 1940 Sex: male  REASON FOR VISIT:   Follow-up after laser ablation of the right great saphenous vein.  HPI:   Scott Whitehead is a pleasant 78 y.o. male who had previously been evaluated by Dr. Donnetta Hutching and set up for laser ablation of the right great saphenous vein.  If he continued to have problems with his varicose veins of the right leg he would return for possible stab phlebectomies in the future.  He underwent laser ablation of the right great saphenous vein on 03/08/2018.  He has been wearing his compression stockings.  He denies significant pain in his leg.  He did have some ecchymosis which resolved.  Of note in reviewing his operative report the patient had a very large vein so I did use 17 W of energy.  A total of 2414 J were used in order to perform endovenous laser ablation to just below the knee.  Current Outpatient Medications  Medication Sig Dispense Refill  . aspirin EC 81 MG tablet Take 81 mg by mouth daily.    . diphenhydrAMINE (BENADRYL) 25 MG tablet Take 25 mg by mouth at bedtime as needed for sleep.    Marland Kitchen ibuprofen (ADVIL,MOTRIN) 200 MG tablet Take 400 mg by mouth every 8 (eight) hours as needed (for pain.).    Marland Kitchen lisinopril (PRINIVIL,ZESTRIL) 5 MG tablet Take 5 mg by mouth daily.    Marland Kitchen MAGNESIUM PO Take by mouth daily.    . metoprolol tartrate (LOPRESSOR) 25 MG tablet TAKE 1 TABLET BY MOUTH TWO  TIMES DAILY 180 tablet 1  . Multiple Vitamin (MULTIVITAMIN WITH MINERALS) TABS tablet Take 1 tablet by mouth daily.    . nitroGLYCERIN (NITROSTAT) 0.4 MG SL tablet Place 1 tablet (0.4 mg total) under the tongue every 5 (five) minutes as needed for chest pain. 25 tablet 11  . Omega-3 Fatty Acids (FISH OIL) 1000 MG CAPS Take 1,000 mg by mouth 2 (two) times daily.     . pantoprazole (PROTONIX) 40 MG tablet Take 40 mg by mouth daily.    . rosuvastatin (CRESTOR) 20 MG tablet TAKE 1 TABLET BY MOUTH  DAILY 90 tablet 2  . tamsulosin  (FLOMAX) 0.4 MG CAPS capsule Take 0.4 mg by mouth daily after supper.     No current facility-administered medications for this visit.     REVIEW OF SYSTEMS:  [X]  denotes positive finding, [ ]  denotes negative finding Vascular    Leg swelling    Cardiac    Chest pain or chest pressure:    Shortness of breath upon exertion:    Short of breath when lying flat:    Irregular heart rhythm:    Constitutional    Fever or chills:     PHYSICAL EXAM:   Vitals:   03/22/18 1438  BP: 107/68  Pulse: 88  Resp: 18  Temp: (!) 97.4 F (36.3 C)  TempSrc: Oral  SpO2: 94%  Weight: 190 lb (86.2 kg)  Height: 6' (1.829 m)    GENERAL: The patient is a well-nourished male, in no acute distress. The vital signs are documented above. CARDIOVASCULAR: There is a regular rate and rhythm. PULMONARY: There is good air exchange bilaterally without wheezing or rales. VASCULAR: He has no significant ecchymosis currently.  There is no significant right leg swelling.  He does have a varicose vein behind his knee on the right which is thrombosed but there is no evidence of active phlebitis.  DATA:   VENOUS  DUPLEX: I have independently interpreted his venous duplex scan today.  There is no evidence of DVT.  The right great saphenous vein was successfully closed up to the saphenofemoral junction.  MEDICAL ISSUES:   STATUS POST LASER ABLATION RIGHT GREAT SAPHENOUS VEIN: The patient is undergone successful laser ablation of the right great saphenous vein.  The clot extends up to just below the saphenofemoral junction.  Of note he is on aspirin.  I encouraged him to stay as active as possible.  At this point he can use knee-high compression stockings if this is more comfortable.  I encouraged him to continue to elevate his leg.  He will return in 3 months.  If he still having problems with his varicosities in the right leg he could be considered for stab phlebectomy.  Deitra Mayo [facility] [phone]

## 2018-05-04 DIAGNOSIS — K219 Gastro-esophageal reflux disease without esophagitis: Secondary | ICD-10-CM | POA: Diagnosis not present

## 2018-05-04 DIAGNOSIS — Z0000 Encounter for general adult medical examination without abnormal findings: Secondary | ICD-10-CM | POA: Diagnosis not present

## 2018-05-04 DIAGNOSIS — Z79899 Other long term (current) drug therapy: Secondary | ICD-10-CM | POA: Diagnosis not present

## 2018-05-04 DIAGNOSIS — R739 Hyperglycemia, unspecified: Secondary | ICD-10-CM | POA: Diagnosis not present

## 2018-05-04 DIAGNOSIS — I10 Essential (primary) hypertension: Secondary | ICD-10-CM | POA: Diagnosis not present

## 2018-05-04 DIAGNOSIS — I2510 Atherosclerotic heart disease of native coronary artery without angina pectoris: Secondary | ICD-10-CM | POA: Diagnosis not present

## 2018-05-04 DIAGNOSIS — E782 Mixed hyperlipidemia: Secondary | ICD-10-CM | POA: Diagnosis not present

## 2018-05-25 ENCOUNTER — Telehealth: Payer: Self-pay | Admitting: Cardiology

## 2018-05-25 MED ORDER — METOPROLOL TARTRATE 25 MG PO TABS: 25.0000 mg | ORAL_TABLET | Freq: Two times a day (BID) | ORAL | 1 refills | Status: DC

## 2018-05-25 NOTE — Telephone Encounter (Signed)
°*  STAT* If patient is at the pharmacy, call can be transferred to refill team.   1. Which medications need to be refilled? (please list name of each medication and dose if known) Metoprolol tartrate 25mg  tablet one tablet twice daily  2. Which pharmacy/location (including street and city if local pharmacy) is medication to be sent to? [facility]  3. Do they need a 30 day or 90 day supply?[place]

## 2018-05-25 NOTE — Telephone Encounter (Signed)
Metoprolol refill sent to [facility] per pt preference

## 2018-06-05 ENCOUNTER — Telehealth: Payer: Self-pay | Admitting: Cardiology

## 2018-06-05 NOTE — Telephone Encounter (Signed)
Virtual Visit Pre-Appointment Phone Call  "(Name), I am calling you today to discuss your upcoming appointment. We are currently trying to limit exposure to the virus that causes COVID-19 by seeing patients at home rather than in the office."  1. "What is the BEST phone number to call the day of the visit?" - include this in appointment notes  2. Do you have or have access to (through a family member/friend) a smartphone with video capability that we can use for your visit?" a. If yes - list this number in appt notes as cell (if different from BEST phone #) and list the appointment type as a VIDEO visit in appointment notes b. If no - list the appointment type as a PHONE visit in appointment notes  3. Confirm consent - "In the setting of the current Covid19 crisis, you are scheduled for a (phone or video) visit with your provider on (date) at (time).  Just as we do with many in-office visits, in order for you to participate in this visit, we must obtain consent.  If you'd like, I can send this to your mychart (if signed up) or email for you to review.  Otherwise, I can obtain your verbal consent now.  All virtual visits are billed to your insurance company just like a normal visit would be.  By agreeing to a virtual visit, we'd like you to understand that the technology does not allow for your provider to perform an examination, and thus may limit your provider's ability to fully assess your condition. If your provider identifies any concerns that need to be evaluated in person, we will make arrangements to do so.  Finally, though the technology is pretty good, we cannot assure that it will always work on either your or our end, and in the setting of a video visit, we may have to convert it to a phone-only visit.  In either situation, we cannot ensure that we have a secure connection.  Are you willing to proceed?" STAFF: Did the patient verbally acknowledge consent to telehealth visit? Document  YES/NO here: YES  4. Advise patient to be prepared - "Two hours prior to your appointment, go ahead and check your blood pressure, pulse, oxygen saturation, and your weight (if you have the equipment to check those) and write them all down. When your visit starts, your provider will ask you for this information. If you have an Apple Watch or Kardia device, please plan to have heart rate information ready on the day of your appointment. Please have a pen and paper handy nearby the day of the visit as well."  5. Give patient instructions for MyChart download to smartphone OR Doximity/Doxy.me as below if video visit (depending on what platform provider is using)  6. Inform patient they will receive a phone call 15 minutes prior to their appointment time (may be from unknown caller ID) so they should be prepared to answer    TELEPHONE CALL NOTE  Scott Whitehead has been deemed a candidate for a follow-up tele-health visit to limit community exposure during the Covid-19 pandemic. I spoke with the patient via phone to ensure availability of phone/video source, confirm preferred email & phone number, and discuss instructions and expectations.  I reminded Scott Whitehead to be prepared with any vital sign and/or heart rhythm information that could potentially be obtained via home monitoring, at the time of his visit. I reminded Scott Whitehead to expect a phone call prior to his visit.  Scott Whitehead 06/05/2018 3:43 PM   INSTRUCTIONS FOR DOWNLOADING THE MYCHART APP TO SMARTPHONE  - The patient must first make sure to have activated MyChart and know their login information - If Apple, go to [employer] and type in MyChart in the search bar and download the app. If Android, ask patient to go to [employer] and type in [place] in the search bar and download the app. The app is free but as with any other app downloads, their phone may require them to verify saved payment information or  Apple/Android password.  - The patient will need to then log into the app with their MyChart username and password, and select [place] as their healthcare provider to link the account. When it is time for your visit, go to the MyChart app, find appointments, and click Begin Video Visit. Be sure to Select Allow for your device to access the Microphone and Camera for your visit. You will then be connected, and your provider will be with you shortly.  **If they have any issues connecting, or need assistance please contact MyChart service desk [phone])**  **If using a computer, in order to ensure the best quality for their visit they will need to use either of the following Internet Browsers: [employer], or Google Chrome**  IF USING DOXIMITY or DOXY.ME - The patient will receive a link just prior to their visit by text.     FULL LENGTH CONSENT FOR TELE-HEALTH VISIT   I hereby voluntarily request, consent and authorize [facility] and its employed or contracted physicians, physician assistants, nurse practitioners or other licensed health care professionals (the Practitioner), to provide me with telemedicine health care services (the Services") as deemed necessary by the treating Practitioner. I acknowledge and consent to receive the Services by the Practitioner via telemedicine. I understand that the telemedicine visit will involve communicating with the Practitioner through live audiovisual communication technology and the disclosure of certain medical information by electronic transmission. I acknowledge that I have been given the opportunity to request an in-person assessment or other available alternative prior to the telemedicine visit and am voluntarily participating in the telemedicine visit.  I understand that I have the right to withhold or withdraw my consent to the use of telemedicine in the course of my care at any time, without affecting my right to future care  or treatment, and that the Practitioner or I may terminate the telemedicine visit at any time. I understand that I have the right to inspect all information obtained and/or recorded in the course of the telemedicine visit and may receive copies of available information for a reasonable fee.  I understand that some of the potential risks of receiving the Services via telemedicine include:   Delay or interruption in medical evaluation due to technological equipment failure or disruption;  Information transmitted may not be sufficient (e.g. poor resolution of images) to allow for appropriate medical decision making by the Practitioner; and/or   In rare instances, security protocols could fail, causing a breach of personal health information.  Furthermore, I acknowledge that it is my responsibility to provide information about my medical history, conditions and care that is complete and accurate to the best of my ability. I acknowledge that Practitioner's advice, recommendations, and/or decision may be based on factors not within their control, such as incomplete or inaccurate data provided by me or distortions of diagnostic images or specimens that may result from electronic transmissions. I understand that the  practice of medicine is not an Chief Strategy Officer and that Practitioner makes no warranties or guarantees regarding treatment outcomes. I acknowledge that I will receive a copy of this consent concurrently upon execution via email to the email address I last provided but may also request a printed copy by calling the office of [facility].    I understand that my insurance will be billed for this visit.   I have read or had this consent read to me.  I understand the contents of this consent, which adequately explains the benefits and risks of the Services being provided via telemedicine.   I have been provided ample opportunity to ask questions regarding this consent and the Services and have had  my questions answered to my satisfaction.  I give my informed consent for the services to be provided through the use of telemedicine in my medical care  By participating in this telemedicine visit I agree to the above.

## 2018-06-19 ENCOUNTER — Other Ambulatory Visit: Payer: Self-pay

## 2018-06-19 ENCOUNTER — Telehealth (INDEPENDENT_AMBULATORY_CARE_PROVIDER_SITE_OTHER): Payer: Medicare Other | Admitting: Cardiology

## 2018-06-19 ENCOUNTER — Encounter: Payer: Self-pay | Admitting: Cardiology

## 2018-06-19 VITALS — BP 155/71 | HR 80 | Wt 190.0 lb

## 2018-06-19 DIAGNOSIS — I2510 Atherosclerotic heart disease of native coronary artery without angina pectoris: Secondary | ICD-10-CM

## 2018-06-19 DIAGNOSIS — I10 Essential (primary) hypertension: Secondary | ICD-10-CM

## 2018-06-19 DIAGNOSIS — I472 Ventricular tachycardia, unspecified: Secondary | ICD-10-CM

## 2018-06-19 DIAGNOSIS — Z9861 Coronary angioplasty status: Secondary | ICD-10-CM

## 2018-06-19 NOTE — Patient Instructions (Signed)

## 2018-06-19 NOTE — Progress Notes (Signed)
Virtual Visit via Telephone Note   This visit type was conducted due to national recommendations for restrictions regarding the COVID-19 Pandemic (e.g. social distancing) in an effort to limit this patient's exposure and mitigate transmission in our community.  Due to his co-morbid illnesses, this patient is at least at moderate risk for complications without adequate follow up.  This format is felt to be most appropriate for this patient at this time.  The patient did not have access to video technology/had technical difficulties with video requiring transitioning to audio format only (telephone).  All issues noted in this document were discussed and addressed.  No physical exam could be performed with this format.  Please refer to the patient's chart for his  consent to telehealth for [facility].  Evaluation Performed:  Follow-up visit  This visit type was conducted due to national recommendations for restrictions regarding the COVID-19 Pandemic (e.g. social distancing).  This format is felt to be most appropriate for this patient at this time.  All issues noted in this document were discussed and addressed.  No physical exam was performed (except for noted visual exam findings with Video Visits).  Please refer to the patient's chart (MyChart message for video visits and phone note for telephone visits) for the patient's consent to telehealth for [facility].  Date:  06/19/2018  ID: Scott Whitehead, DOB 15-May-1940, MRN [id number]   Patient Location: [place] [id number]   Provider location:   [place] Office  PCP:  Myer Peer, MD  Cardiologist:  Jenne Campus, MD     Chief Complaint: Doing well  History of Present Illness:    Scott Whitehead is a 78 y.o. male  who presents via audio/video conferencing for a telehealth visit today.  With coronary artery disease status post atrial flutter ablation, no sustained ventricular tachycardia with  quite extensive work-up being done and negative.  He does have a visit with me today.  He supposed to have a video visit however cell signal is so weak at his home that we are unable to do this therefore we do have just telephone conversation.  He is doing great in the matter-of-fact he was outside working he does have a leak in the pool and trying to fix it.  Denies having any chest pain tightness squeezing pressure burning chest.  Completely asymptomatic and very happy the way he feels   The patient does not have symptoms concerning for COVID-19 infection (fever, chills, cough, or new SHORTNESS OF BREATH).    Prior CV studies:   The following studies were reviewed today:       Past Medical History:  Diagnosis Date  . Cancer ([place])   . GERD (gastroesophageal reflux disease)   . Hyperlipidemia   . Hypertension     Past Surgical History:  Procedure Laterality Date  . CARDIAC SURGERY     heart monitor is placed in the heart area  . ENDOVENOUS ABLATION SAPHENOUS VEIN W/ LASER Right 03/08/2018   endovenous laser ablation right greater saphenous vein by Deitra Mayo MD   . LEFT HEART CATH AND CORONARY ANGIOGRAPHY N/A 09/13/2016   Procedure: Left Heart Cath and Coronary Angiography;  Surgeon: Leonie Man, MD;  Location: [facility];  Service: Cardiovascular;  Laterality: N/A;  . LOOP RECORDER IMPLANT       Current Meds  Medication Sig  . aspirin EC 81 MG tablet Take 81 mg by mouth daily.  Marland Kitchen  diphenhydrAMINE (BENADRYL) 25 MG tablet Take 25 mg by mouth at bedtime as needed for sleep.  Marland Kitchen ibuprofen (ADVIL,MOTRIN) 200 MG tablet Take 400 mg by mouth every 8 (eight) hours as needed (for pain.).  Marland Kitchen lisinopril (PRINIVIL,ZESTRIL) 5 MG tablet Take 5 mg by mouth daily.  Marland Kitchen MAGNESIUM PO Take by mouth daily.  . metoprolol tartrate (LOPRESSOR) 25 MG tablet Take 1 tablet (25 mg total) by mouth 2 (two) times daily.  . Multiple Vitamin (MULTIVITAMIN WITH MINERALS) TABS tablet Take 1  tablet by mouth daily.  . nitroGLYCERIN (NITROSTAT) 0.4 MG SL tablet Place 1 tablet (0.4 mg total) under the tongue every 5 (five) minutes as needed for chest pain.  . Omega-3 Fatty Acids (FISH OIL) 1000 MG CAPS Take 1,000 mg by mouth 2 (two) times daily.   . pantoprazole (PROTONIX) 40 MG tablet Take 40 mg by mouth daily.  . rosuvastatin (CRESTOR) 20 MG tablet TAKE 1 TABLET BY MOUTH  DAILY  . tamsulosin (FLOMAX) 0.4 MG CAPS capsule Take 0.4 mg by mouth daily after supper.      Family History: The patient's family history includes Alzheimer's disease in his father; Blindness in his father; Glaucoma in his father; Heart disease in his mother; Hypertension in his father and mother.   ROS:   Please see the history of present illness.     All other systems reviewed and are negative.   Labs/Other Tests and Data Reviewed:     Recent Labs: No results found for requested labs within last 8760 hours.  Recent Lipid Panel    Component Value Date/Time   CHOL 132 01/01/2018 0807   TRIG 120 01/01/2018 0807   HDL 38 (L) 01/01/2018 0807   CHOLHDL 3.5 01/01/2018 0807   LDLCALC 70 01/01/2018 0807      Exam:    Vital Signs:  BP (!) 155/71   Pulse 80   Wt 190 lb (86.2 kg)   BMI 25.77 kg/m     Wt Readings from Last 3 Encounters:  06/19/18 190 lb (86.2 kg)  03/22/18 190 lb (86.2 kg)  03/08/18 190 lb (86.2 kg)     Well nourished, well developed in no acute distress. Alert awake needed x3 talking to me over the phone Diagnosis for this visit:   1. Essential hypertension   2. VT (ventricular tachycardia) ([place])   3. CAD S/P percutaneous coronary angioplasty   4. Coronary artery disease involving native coronary artery of native heart without angina pectoris      ASSESSMENT & PLAN:    1.  Essential hypertension doing well from that point review we will continue present management. 2.  History of ventricular tachycardia asymptomatic no syncope extensive work-up done has been  negative. 3.  Coronary artery disease stable asymptomatic.  COVID-19 Education: The signs and symptoms of COVID-19 were discussed with the patient and how to seek care for testing (follow up with PCP or arrange E-visit).  The importance of social distancing was discussed today.  Patient Risk:   After full review of this patients clinical status, I feel that they are at least moderate risk at this time.  Time:   Today, I have spent 16 minutes with the patient with telehealth technology discussing pt health issues.  I spent 5 minutes reviewing her chart before the visit.  Visit was finished at 4:22 PM.    Medication Adjustments/Labs and Tests Ordered: Current medicines are reviewed at length with the patient today.  Concerns regarding medicines are outlined above.  No orders of the defined types were placed in this encounter.  Medication changes: No orders of the defined types were placed in this encounter.    Disposition: Follow-up in 6 months  Signed, Park Liter, MD, [facility] 06/19/2018 4:23 PM    [place]

## 2018-06-21 ENCOUNTER — Ambulatory Visit: Payer: Medicare Other | Admitting: Vascular Surgery

## 2018-07-26 ENCOUNTER — Encounter: Payer: Self-pay | Admitting: Vascular Surgery

## 2018-07-26 ENCOUNTER — Other Ambulatory Visit: Payer: Self-pay

## 2018-07-26 ENCOUNTER — Ambulatory Visit: Payer: Medicare Other | Admitting: Vascular Surgery

## 2018-07-26 VITALS — BP 125/76 | HR 64 | Temp 97.9°F | Resp 18 | Ht 72.0 in | Wt 183.0 lb

## 2018-07-26 DIAGNOSIS — I872 Venous insufficiency (chronic) (peripheral): Secondary | ICD-10-CM

## 2018-07-26 DIAGNOSIS — I83811 Varicose veins of right lower extremities with pain: Secondary | ICD-10-CM

## 2018-07-26 NOTE — Progress Notes (Signed)
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  HPI:   Scott Whitehead is a pleasant 78 y.o. male who had originally been evaluated by Dr. Sherren Mocha Early with painful varicose veins of the right lower extremity.  On 03/08/2018 the patient underwent laser ablation of the right great saphenous vein.  Since I saw him last he states that he is continued to have some pain in the lower leg that usually occurs at night.  There are really no aggravating or alleviating factors.  He remains fairly active.  He denies any previous history of DVT or phlebitis.  He has had no real symptoms in the left leg.  He does continue to have some painful varicose veins of the right leg.  He continues to occasionally wear his compression stockings.  He elevates his leg as needed.  He tries to avoid prolonged sitting and standing.   Current Outpatient Medications  Medication Sig Dispense Refill  . aspirin EC 81 MG tablet Take 81 mg by mouth daily.    . diphenhydrAMINE (BENADRYL) 25 MG tablet Take 25 mg by mouth at bedtime as needed for sleep.    Marland Kitchen ibuprofen (ADVIL,MOTRIN) 200 MG tablet Take 400 mg by mouth every 8 (eight) hours as needed (for pain.).    Marland Kitchen lisinopril (PRINIVIL,ZESTRIL) 5 MG tablet Take 5 mg by mouth daily.    Marland Kitchen MAGNESIUM PO Take by mouth daily.    . metoprolol tartrate (LOPRESSOR) 25 MG tablet Take 1 tablet (25 mg total) by mouth 2 (two) times daily. 180 tablet 1  . Multiple Vitamin (MULTIVITAMIN WITH MINERALS) TABS tablet Take 1 tablet by mouth daily.    . nitroGLYCERIN (NITROSTAT) 0.4 MG SL tablet Place 1 tablet (0.4 mg total) under the tongue every 5 (five) minutes as needed for chest pain. 25 tablet 11  . Omega-3 Fatty Acids (FISH OIL) 1000 MG CAPS Take 1,000 mg by mouth 2 (two) times daily.     . pantoprazole (PROTONIX) 40 MG tablet Take 40 mg by mouth daily.    . rosuvastatin (CRESTOR) 20 MG tablet  TAKE 1 TABLET BY MOUTH  DAILY 90 tablet 2  . tamsulosin (FLOMAX) 0.4 MG CAPS capsule Take 0.4 mg by mouth daily after supper.     No current facility-administered medications for this visit.     REVIEW OF SYSTEMS:  [X]  denotes positive finding, [ ]  denotes negative finding Vascular    Leg swelling    Cardiac    Chest pain or chest pressure:    Shortness of breath upon exertion:    Short of breath when lying flat:    Irregular heart rhythm:    Constitutional    Fever or chills:     PHYSICAL EXAM:   Vitals:   07/26/18 1342  BP: 125/76  Pulse: 64  Resp: 18  Temp: 97.9 F (36.6 C)  TempSrc: Temporal  SpO2: 98%  Weight: 183 lb (83 kg)  Height: 6' (1.829 m)    GENERAL: The patient is a well-nourished male, in no acute distress. The vital signs are documented above. CARDIOVASCULAR: There is a regular rate and rhythm. PULMONARY: There is good air exchange bilaterally without wheezing or rales. VASCULAR: He has palpable pedal pulses. He has some varicose veins that appear to be under elevated pressure in the posterior calf lateral malleolus and medial right calf. He has hyperpigmentation bilaterally but more significantly on the  right side.  DATA:   No new data  MEDICAL ISSUES:   CHRONIC VENOUS INSUFFICIENCY: Patient continues to have some pain associated with his venous disease on the right.  There is some varicose veins which appear to get under elevated pressure and I suspect that he has some underlying incompetent perforators.  We have discussed possible stab phlebectomies of his distended varicose veins.  I think this could potentially help his symptoms.  We have discussed the procedure and potential complications and he will consider this and let us know if he elects to proceed.  He would require 10-20 stabs.  Deitra Mayo [facility] [phone]

## 2018-08-05 ENCOUNTER — Other Ambulatory Visit: Payer: Self-pay | Admitting: Cardiology

## 2018-08-23 ENCOUNTER — Ambulatory Visit (INDEPENDENT_AMBULATORY_CARE_PROVIDER_SITE_OTHER): Payer: Medicare Other | Admitting: Vascular Surgery

## 2018-08-23 ENCOUNTER — Encounter: Payer: Self-pay | Admitting: Vascular Surgery

## 2018-08-23 ENCOUNTER — Other Ambulatory Visit: Payer: Self-pay

## 2018-08-23 VITALS — BP 141/68 | HR 53 | Temp 97.3°F | Resp 16 | Ht 72.0 in | Wt 179.0 lb

## 2018-08-23 DIAGNOSIS — I83811 Varicose veins of right lower extremities with pain: Secondary | ICD-10-CM

## 2018-08-23 DIAGNOSIS — I872 Venous insufficiency (chronic) (peripheral): Secondary | ICD-10-CM | POA: Diagnosis not present

## 2018-08-23 HISTORY — PX: OTHER SURGICAL HISTORY: SHX169

## 2018-08-23 NOTE — Progress Notes (Signed)
    Stab Phlebectomy Procedure  Alvon Nygaard DOB:December 01, 1940  08/23/2018  Consent signed: Yes  Surgeon:C. Scot Dock, MD  Procedure: stab phlebectomy: right leg  BP (!) 141/68 (BP Location: Left Arm, Patient Position: Sitting, Cuff Size: Normal)   Pulse (!) 53   Temp (!) 97.3 F (36.3 C) (Temporal)   Resp 16   Ht 6' (1.829 m)   Wt 179 lb (81.2 kg)   SpO2 98%   BMI 24.28 kg/m   Start time: 11:30 AM   End time: 12:!5 PM    Tumescent Anesthesia: 200 cc 0.9% NaCl with 50 cc Lidocaine HCL 1 % and 15 cc 8.4% NaHCO3  Local Anesthesia: 6 cc Lidocaine HCL and NaHCO3 (ratio 2:1)    Stab Phlebectomy: 10-20 Sites: Calf and Ankle  Patient tolerated procedure well: Yes  Notes: Mr. Fester wore face mask. All staff members wore facial masks and facial shields.    Description of Procedure:  After marking the course of the secondary varicosities, the patient was placed on the operating table in the prone position, and the right leg was prepped and draped in sterile fashion.    The patient was then put into Trendelenburg position.  Local anesthetic was administered at the previously marked varicosities, and tumescent anesthesia was administered around the vessels.  Ten to 20 stab wounds were made using the tip of an 11 blade. And using the vein hook, the phlebectomies were performed using a hemostat to avulse the varicosities.  Adequate hemostasis was achieved, and steri strips were applied to the stab wound.      ABD pads and thigh high compression stockings were applied as well ace wraps where needed. Blood loss was less than 15 cc.  The patient ambulated out of the operating room having tolerated the procedure well.

## 2018-08-23 NOTE — Progress Notes (Signed)
   Patient name: Scott Whitehead MRN: [id number] DOB: [birth year] Sex: male  REASON FOR VISIT:   For stab phlebectomies right leg  HPI:   Scott Whitehead is a pleasant 78 y.o. male who presents for staged stab phlebectomies of the right leg.  Current Outpatient Medications  Medication Sig Dispense Refill  . aspirin EC 81 MG tablet Take 81 mg by mouth daily.    . diphenhydrAMINE (BENADRYL) 25 MG tablet Take 25 mg by mouth at bedtime as needed for sleep.    Marland Kitchen ibuprofen (ADVIL,MOTRIN) 200 MG tablet Take 400 mg by mouth every 8 (eight) hours as needed (for pain.).    Marland Kitchen lisinopril (PRINIVIL,ZESTRIL) 5 MG tablet Take 5 mg by mouth daily.    Marland Kitchen MAGNESIUM PO Take by mouth daily.    . metoprolol tartrate (LOPRESSOR) 25 MG tablet Take 1 tablet (25 mg total) by mouth 2 (two) times daily. 180 tablet 1  . Multiple Vitamin (MULTIVITAMIN WITH MINERALS) TABS tablet Take 1 tablet by mouth daily.    . nitroGLYCERIN (NITROSTAT) 0.4 MG SL tablet Place 1 tablet (0.4 mg total) under the tongue every 5 (five) minutes as needed for chest pain. 25 tablet 11  . Omega-3 Fatty Acids (FISH OIL) 1000 MG CAPS Take 1,000 mg by mouth 2 (two) times daily.     . pantoprazole (PROTONIX) 40 MG tablet Take 40 mg by mouth daily.    . rosuvastatin (CRESTOR) 20 MG tablet TAKE 1 TABLET BY MOUTH  DAILY 90 tablet 2  . tamsulosin (FLOMAX) 0.4 MG CAPS capsule Take 0.4 mg by mouth daily after supper.     No current facility-administered medications for this visit.     REVIEW OF SYSTEMS:  [X]  denotes positive finding, [ ]  denotes negative finding Vascular    Leg swelling    Cardiac    Chest pain or chest pressure:    Shortness of breath upon exertion:    Short of breath when lying flat:    Irregular heart rhythm:    Constitutional    Fever or chills:     PHYSICAL EXAM:   Vitals:   08/23/18 1116  BP: (!) 141/68  Pulse: (!) 53  Resp: 16  Temp: (!) 97.3 F (36.3 C)  TempSrc: Temporal  SpO2: 98%  Weight: 179 lb (81.2  kg)  Height: 6' (1.829 m)    GENERAL: The patient is a well-nourished male, in no acute distress. The vital signs are documented above. CARDIOVASCULAR: There is a regular rate and rhythm. PULMONARY: There is good air exchange bilaterally without wheezing or rales. He has moderate sized varicose veins in his posterior right calf at the popliteal fossa and also medial calf.  DATA:   No new data  MEDICAL ISSUES:   10-20 STAB PHLEBECTOMIES RIGHT LEG: Patient was brought to the exam room and the veins were marked with the patient standing.  The patient was then placed prone.  The right leg was prepped and draped in the usual sterile fashion.  Using approximately 15 stab incisions the varicose veins were teased out of the skin with a hook and then bluntly removed with a hemostat.  Pressure was held for hemostasis.  Sterile dressing was applied.  The patient tolerated the procedure well.  He will be seen back as needed.  Deitra Mayo [facility] [phone]

## 2018-08-29 ENCOUNTER — Other Ambulatory Visit: Payer: Self-pay | Admitting: Cardiology

## 2018-08-29 MED ORDER — ROSUVASTATIN CALCIUM 20 MG PO TABS: 20.0000 mg | ORAL_TABLET | Freq: Every day | ORAL | 1 refills | Status: DC

## 2018-08-29 NOTE — Telephone Encounter (Signed)
°*  STAT* If patient is at the pharmacy, call can be transferred to refill team.   1. Which medications need to be refilled? (please list name of each medication and dose if known) Roustatin 20mg  takes 1 daily   2. Which pharmacy/location (including street and city if local pharmacy) is medication to be sent to?[facility]  3. Do they need a 30 day or 90 day supply? 90 with 3 refills

## 2018-08-29 NOTE — Telephone Encounter (Signed)
Rx for rosuvastatin 20mg  one tablet daily sent to [facility] as requested.

## 2018-11-29 ENCOUNTER — Other Ambulatory Visit: Payer: Self-pay | Admitting: Cardiology

## 2018-12-24 ENCOUNTER — Other Ambulatory Visit: Payer: Self-pay

## 2018-12-24 ENCOUNTER — Ambulatory Visit (INDEPENDENT_AMBULATORY_CARE_PROVIDER_SITE_OTHER): Payer: Medicare Other | Admitting: Cardiology

## 2018-12-24 ENCOUNTER — Encounter: Payer: Self-pay | Admitting: Cardiology

## 2018-12-24 VITALS — BP 138/64 | HR 69 | Ht 72.0 in | Wt 188.8 lb

## 2018-12-24 DIAGNOSIS — I10 Essential (primary) hypertension: Secondary | ICD-10-CM

## 2018-12-24 DIAGNOSIS — I472 Ventricular tachycardia, unspecified: Secondary | ICD-10-CM

## 2018-12-24 DIAGNOSIS — I2510 Atherosclerotic heart disease of native coronary artery without angina pectoris: Secondary | ICD-10-CM

## 2018-12-24 DIAGNOSIS — Z9861 Coronary angioplasty status: Secondary | ICD-10-CM

## 2018-12-24 DIAGNOSIS — R0600 Dyspnea, unspecified: Secondary | ICD-10-CM

## 2018-12-24 DIAGNOSIS — E785 Hyperlipidemia, unspecified: Secondary | ICD-10-CM | POA: Diagnosis not present

## 2018-12-24 DIAGNOSIS — R0609 Other forms of dyspnea: Secondary | ICD-10-CM

## 2018-12-24 HISTORY — DX: Dyspnea, unspecified: R06.00

## 2018-12-24 HISTORY — DX: Other forms of dyspnea: R06.09

## 2018-12-24 LAB — LIPID PANEL
Chol/HDL Ratio: 3.6 ratio (ref 0.0–5.0)
Cholesterol, Total: 151 mg/dL (ref 100–199)
HDL: 42 mg/dL (ref >39)
LDL Chol Calc (NIH): 86 mg/dL (ref 0–99)
Triglycerides: 131 mg/dL (ref 0–149)
VLDL Cholesterol Cal: 23 mg/dL (ref 5–40)

## 2018-12-24 NOTE — Progress Notes (Signed)
Cardiology Office Note:    Date:  12/24/2018   ID:  Scott Whitehead, DOB 1940-12-13, MRN [id number]  PCP:  Street, Sharon Mt, MD  Cardiologist:  Jenne Campus, MD    Referring MD: Street, Sharon Mt, *   Chief Complaint  Patient presents with  . Follow-up  Fatigue and shortness of breath  History of Present Illness:    Scott Whitehead is a 78 y.o. male with history of coronary artery disease status post PTCA and stenting done years ago, essential hypertension, history of ventricular tachycardia with work-up being negative, comes today to my office for follow-up recently he went with his grandchildren to [place] he had to climb 275 steps he became severely short of breath when it happened.  He had to stop twice actually.  There was no chest pain, no tightness, no pressure, no burning in the chest otherwise he did well obviously very concerned about this scenario.  Denies having any dizziness or passing out but this is described 2 episodes when he became kind of hot for a split second.  Fleshy-like sensation.  Past Medical History:  Diagnosis Date  . Cancer ([place])   . GERD (gastroesophageal reflux disease)   . Hyperlipidemia   . Hypertension     Past Surgical History:  Procedure Laterality Date  . CARDIAC SURGERY     heart monitor is placed in the heart area  . ENDOVENOUS ABLATION SAPHENOUS VEIN W/ LASER Right 03/08/2018   endovenous laser ablation right greater saphenous vein by Deitra Mayo MD   . LEFT HEART CATH AND CORONARY ANGIOGRAPHY N/A 09/13/2016   Procedure: Left Heart Cath and Coronary Angiography;  Surgeon: Leonie Man, MD;  Location: [place] [facility];  Service: Cardiovascular;  Laterality: N/A;  . LOOP RECORDER IMPLANT    . stab phlebectomy  Right 08/23/2018   stab phlebectomy 10-20 incisions right leg by Chritopher Scot Dock MD     Current Medications: Current Meds  Medication Sig  . aspirin EC 81 MG tablet Take 81 mg by mouth daily.  .  diphenhydrAMINE (BENADRYL) 25 MG tablet Take 25 mg by mouth at bedtime as needed for sleep.  . finasteride (PROSCAR) 5 MG tablet Take 1 tablet by mouth daily.  Marland Kitchen ibuprofen (ADVIL,MOTRIN) 200 MG tablet Take 400 mg by mouth every 8 (eight) hours as needed (for pain.).  Marland Kitchen lisinopril (PRINIVIL,ZESTRIL) 5 MG tablet Take 5 mg by mouth daily.  Marland Kitchen MAGNESIUM PO Take by mouth daily.  . Melatonin 10 MG TABS Take 1 tablet by mouth as needed.  . metoprolol tartrate (LOPRESSOR) 25 MG tablet TAKE 1 TABLET BY MOUTH  TWICE DAILY  . Multiple Vitamin (MULTIVITAMIN WITH MINERALS) TABS tablet Take 1 tablet by mouth daily.  . nitroGLYCERIN (NITROSTAT) 0.4 MG SL tablet Place 1 tablet (0.4 mg total) under the tongue every 5 (five) minutes as needed for chest pain.  . Omega-3 Fatty Acids (FISH OIL) 1000 MG CAPS Take 1,000 mg by mouth 2 (two) times daily.   . pantoprazole (PROTONIX) 40 MG tablet Take 40 mg by mouth daily.  . rosuvastatin (CRESTOR) 20 MG tablet Take 1 tablet (20 mg total) by mouth daily.  . tamsulosin (FLOMAX) 0.4 MG CAPS capsule Take 0.4 mg by mouth daily after supper.     Allergies:   Patient has no known allergies.   Social History   Socioeconomic History  . Marital status: Married    Spouse name: Not on file  . Number of children: Not on file  .  Years of education: Not on file  . Highest education level: Not on file  Occupational History  . Not on file  Social Needs  . Financial resource strain: Not on file  . Food insecurity    Worry: Not on file    Inability: Not on file  . Transportation needs    Medical: Not on file    Non-medical: Not on file  Tobacco Use  . Smoking status: Never Smoker  . Smokeless tobacco: Never Used  Substance and Sexual Activity  . Alcohol use: No  . Drug use: No  . Sexual activity: Not on file  Lifestyle  . Physical activity    Days per week: Not on file    Minutes per session: Not on file  . Stress: Not on file  Relationships  . Social Product manager on phone: Not on file    Gets together: Not on file    Attends religious service: Not on file    Active member of club or organization: Not on file    Attends meetings of clubs or organizations: Not on file    Relationship status: Not on file  Other Topics Concern  . Not on file  Social History Narrative  . Not on file     Family History: The patient's family history includes Alzheimer's disease in his father; Blindness in his father; Glaucoma in his father; Heart disease in his mother; Hypertension in his father and mother. ROS:   Please see the history of present illness.    All 14 point review of systems negative except as described per history of present illness  EKGs/Labs/Other Studies Reviewed:      Recent Labs: No results found for requested labs within last 8760 hours.  Recent Lipid Panel    Component Value Date/Time   CHOL 132 01/01/2018 0807   TRIG 120 01/01/2018 0807   HDL 38 (L) 01/01/2018 0807   CHOLHDL 3.5 01/01/2018 0807   LDLCALC 70 01/01/2018 0807    Physical Exam:    VS:  BP 138/64   Pulse 69   Ht 6' (1.829 m)   Wt 188 lb 12.8 oz (85.6 kg)   SpO2 98%   BMI 25.61 kg/m     Wt Readings from Last 3 Encounters:  12/24/18 188 lb 12.8 oz (85.6 kg)  08/23/18 179 lb (81.2 kg)  07/26/18 183 lb (83 kg)     GEN:  Well nourished, well developed in no acute distress HEENT: Normal NECK: No JVD; No carotid bruits LYMPHATICS: No lymphadenopathy CARDIAC: RRR, no murmurs, no rubs, no gallops RESPIRATORY:  Clear to auscultation without rales, wheezing or rhonchi  ABDOMEN: Soft, non-tender, non-distended MUSCULOSKELETAL:  No edema; No deformity  SKIN: Warm and dry LOWER EXTREMITIES: no swelling NEUROLOGIC:  Alert and oriented x 3 PSYCHIATRIC:  Normal affect   ASSESSMENT:    1. VT (ventricular tachycardia) (HCC)   2. Dyspnea on exertion   3. Essential hypertension   4. CAD S/P percutaneous coronary angioplasty   5. Hyperlipidemia with target  low density lipoprotein (LDL) cholesterol less than 70 mg/dL    PLAN:    In order of problems listed above:  1. History of ventricular tachycardia.  He described to have some strange episodes of flushing-like sensation.  I will schedule him to have an echocardiogram as well as stress test to stratify the arrhythmia that he got before previously dose test were negative. 2. Dyspnea on exertion will do echocardiogram to assess  left ventricle ejection fraction, will do stress test to make sure he does not have angina equivalent. 3. Essential hypertension blood pressure seems well controlled continue present management. 4. History of coronary artery disease stress test will be done 5. Dyslipidemia we will check fasting lipid profile today   Medication Adjustments/Labs and Tests Ordered: Current medicines are reviewed at length with the patient today.  Concerns regarding medicines are outlined above.  No orders of the defined types were placed in this encounter.  Medication changes: No orders of the defined types were placed in this encounter.   Signed, Park Liter, MD, [facility] 12/24/2018 10:34 AM    [place]

## 2018-12-24 NOTE — Patient Instructions (Signed)
Medication Instructions:  Your physician recommends that you continue on your current medications as directed. Please refer to the Current Medication list given to you today.  *If you need a refill on your cardiac medications before your next appointment, please call your pharmacy*  Lab Work: Your physician recommends that you return for lab work today: Lipids   If you have labs (blood work) drawn today and your tests are completely normal, you will receive your results only by:  [place] (if you have MyChart) OR  A paper copy in the mail If you have any lab test that is abnormal or we need to change your treatment, we will call you to review the results.  Testing/Procedures: Your physician has requested that you have an echocardiogram. Echocardiography is a painless test that uses sound waves to create images of your heart. It provides your doctor with information about the size and shape of your heart and how well your hearts chambers and valves are working. This procedure takes approximately one hour. There are no restrictions for this procedure.  Your physician has requested that you have a lexiscan myoview. For further information please visit [email or web address]. Please follow instruction sheet, as given.    Follow-Up: At [facility], you and your health needs are our priority.  As part of our continuing mission to provide you with exceptional heart care, we have created designated Provider Care Teams.  These Care Teams include your primary Cardiologist (physician) and Advanced Practice Providers (APPs -  Physician Assistants and Nurse Practitioners) who all work together to provide you with the care you need, when you need it.  Your next appointment:   2 month   The format for your next appointment:   In Person  Provider:   Jenne Campus, MD  Other Instructions   Echocardiogram An echocardiogram is a procedure that uses painless sound waves (ultrasound) to  produce an image of the heart. Images from an echocardiogram can provide important information about:  Signs of coronary artery disease (CAD).  Aneurysm detection. An aneurysm is a weak or damaged part of an artery wall that bulges out from the normal force of blood pumping through the body.  Heart size and shape. Changes in the size or shape of the heart can be associated with certain conditions, including heart failure, aneurysm, and CAD.  Heart muscle function.  Heart valve function.  Signs of a past heart attack.  Fluid buildup around the heart.  Thickening of the heart muscle.  A tumor or infectious growth around the heart valves. Tell a health care provider about:  Any allergies you have.  All medicines you are taking, including vitamins, herbs, eye drops, creams, and over-the-counter medicines.  Any blood disorders you have.  Any surgeries you have had.  Any medical conditions you have.  Whether you are pregnant or may be pregnant. What are the risks? Generally, this is a safe procedure. However, problems may occur, including:  Allergic reaction to dye (contrast) that may be used during the procedure. What happens before the procedure? No specific preparation is needed. You may eat and drink normally. What happens during the procedure?   An IV tube may be inserted into one of your veins.  You may receive contrast through this tube. A contrast is an injection that improves the quality of the pictures from your heart.  A gel will be applied to your chest.  A wand-like tool (transducer) will be moved over your chest. The gel will help  to transmit the sound waves from the transducer.  The sound waves will harmlessly bounce off of your heart to allow the heart images to be captured in real-time motion. The images will be recorded on a computer. The procedure may vary among health care providers and hospitals. What happens after the procedure?  You may return to  your normal, everyday life, including diet, activities, and medicines, unless your health care provider tells you not to do that. Summary  An echocardiogram is a procedure that uses painless sound waves (ultrasound) to produce an image of the heart.  Images from an echocardiogram can provide important information about the size and shape of your heart, heart muscle function, heart valve function, and fluid buildup around your heart.  You do not need to do anything to prepare before this procedure. You may eat and drink normally.  After the echocardiogram is completed, you may return to your normal, everyday life, unless your health care provider tells you not to do that. This information is not intended to replace advice given to you by your health care provider. Make sure you discuss any questions you have with your health care provider. Document Released: 01/29/2000 Document Revised: 05/24/2018 Document Reviewed: 03/05/2016 Elsevier Patient Education  2020 [place].   Cardiac Nuclear Scan A cardiac nuclear scan is a test that measures blood flow to the heart when a person is resting and when he or she is exercising. The test looks for problems such as:  Not enough blood reaching a portion of the heart.  The heart muscle not working normally. You may need this test if:  You have heart disease.  You have had abnormal lab results.  You have had heart surgery or a balloon procedure to open up blocked arteries (angioplasty).  You have chest pain.  You have shortness of breath. In this test, a radioactive dye (tracer) is injected into your bloodstream. After the tracer has traveled to your heart, an imaging device is used to measure how much of the tracer is absorbed by or distributed to various areas of your heart. This procedure is usually done at a hospital and takes 2-4 hours. Tell a health care provider about:  Any allergies you have.  All medicines you are taking,  including vitamins, herbs, eye drops, creams, and over-the-counter medicines.  Any problems you or family members have had with anesthetic medicines.  Any blood disorders you have.  Any surgeries you have had.  Any medical conditions you have.  Whether you are pregnant or may be pregnant. What are the risks? Generally, this is a safe procedure. However, problems may occur, including:  Serious chest pain and heart attack. This is only a risk if the stress portion of the test is done.  Rapid heartbeat.  Sensation of warmth in your chest. This usually passes quickly.  Allergic reaction to the tracer. What happens before the procedure?  Ask your health care provider about changing or stopping your regular medicines. This is especially important if you are taking diabetes medicines or blood thinners.  Follow instructions from your health care provider about eating or drinking restrictions.  Remove your jewelry on the day of the procedure. What happens during the procedure?  An IV will be inserted into one of your veins.  Your health care provider will inject a small amount of radioactive tracer through the IV.  You will wait for 20-40 minutes while the tracer travels through your bloodstream.  Your heart activity will be  monitored with an electrocardiogram (ECG).  You will lie down on an exam table.  Images of your heart will be taken for about 15-20 minutes.  You may also have a stress test. For this test, one of the following may be done: ? You will exercise on a treadmill or stationary bike. While you exercise, your heart's activity will be monitored with an ECG, and your blood pressure will be checked. ? You will be given medicines that will increase blood flow to parts of your heart. This is done if you are unable to exercise.  When blood flow to your heart has peaked, a tracer will again be injected through the IV.  After 20-40 minutes, you will get back on the exam  table and have more images taken of your heart.  Depending on the type of tracer used, scans may need to be repeated 3-4 hours later.  Your IV line will be removed when the procedure is over. The procedure may vary among health care providers and hospitals. What happens after the procedure?  Unless your health care provider tells you otherwise, you may return to your normal schedule, including diet, activities, and medicines.  Unless your health care provider tells you otherwise, you may increase your fluid intake. This will help to flush the contrast dye from your body. Drink enough fluid to keep your urine pale yellow.  Ask your health care provider, or the department that is doing the test: ? When will my results be ready? ? How will I get my results? Summary  A cardiac nuclear scan measures the blood flow to the heart when a person is resting and when he or she is exercising.  Tell your health care provider if you are pregnant.  Before the procedure, ask your health care provider about changing or stopping your regular medicines. This is especially important if you are taking diabetes medicines or blood thinners.  After the procedure, unless your health care provider tells you otherwise, increase your fluid intake. This will help flush the contrast dye from your body.  After the procedure, unless your health care provider tells you otherwise, you may return to your normal schedule, including diet, activities, and medicines. This information is not intended to replace advice given to you by your health care provider. Make sure you discuss any questions you have with your health care provider. Document Released: 02/26/2004 Document Revised: 07/17/2017 Document Reviewed: 07/17/2017 Elsevier Patient Education  2020 [employer].

## 2018-12-24 NOTE — Addendum Note (Signed)
Addended by: Ashok Norris on: 12/24/2018 10:43 AM   Modules accepted: Orders

## 2018-12-27 ENCOUNTER — Telehealth (HOSPITAL_COMMUNITY): Payer: Self-pay | Admitting: *Deleted

## 2018-12-27 NOTE — Telephone Encounter (Signed)
Patient given detailed instructions per Myocardial Perfusion Study Information Sheet for the test on 01/03/19. Patient notified to arrive 15 minutes early and that it is imperative to arrive on time for appointment to keep from having the test rescheduled.  If you need to cancel or reschedule your appointment, please call the office within 24 hours of your appointment. . Patient verbalized understanding. Kirstie Peri

## 2019-01-02 ENCOUNTER — Telehealth: Payer: Self-pay | Admitting: Emergency Medicine

## 2019-01-02 DIAGNOSIS — E785 Hyperlipidemia, unspecified: Secondary | ICD-10-CM

## 2019-01-02 NOTE — Telephone Encounter (Signed)
Left message for patient to return call.

## 2019-01-03 ENCOUNTER — Other Ambulatory Visit: Payer: Self-pay

## 2019-01-03 ENCOUNTER — Ambulatory Visit (INDEPENDENT_AMBULATORY_CARE_PROVIDER_SITE_OTHER): Payer: Medicare Other

## 2019-01-03 DIAGNOSIS — I10 Essential (primary) hypertension: Secondary | ICD-10-CM

## 2019-01-03 DIAGNOSIS — R0600 Dyspnea, unspecified: Secondary | ICD-10-CM | POA: Diagnosis not present

## 2019-01-03 DIAGNOSIS — I472 Ventricular tachycardia: Secondary | ICD-10-CM

## 2019-01-03 LAB — MYOCARDIAL PERFUSION IMAGING
LV dias vol: 79 mL (ref 62–150)
LV sys vol: 24 mL
Peak HR: 121 {beats}/min
Rest HR: 66 {beats}/min
SDS: 2
SRS: 7
SSS: 9
TID: 0.91

## 2019-01-03 MED ORDER — TECHNETIUM TC 99M TETROFOSMIN IV KIT
31.1000 | PACK | Freq: Once | INTRAVENOUS | Status: AC | PRN
  Administered 2019-01-03: 31.1 via INTRAVENOUS

## 2019-01-03 MED ORDER — REGADENOSON 0.4 MG/5ML IV SOLN
0.4000 mg | Freq: Once | INTRAVENOUS | Status: AC
  Administered 2019-01-03: 0.4 mg via INTRAVENOUS

## 2019-01-03 MED ORDER — TECHNETIUM TC 99M TETROFOSMIN IV KIT
10.9000 | PACK | Freq: Once | INTRAVENOUS | Status: AC | PRN
  Administered 2019-01-03: 10.9 via INTRAVENOUS

## 2019-01-04 ENCOUNTER — Other Ambulatory Visit: Payer: Self-pay | Admitting: Cardiology

## 2019-01-04 MED ORDER — ROSUVASTATIN CALCIUM 20 MG PO TABS: 40.0000 mg | ORAL_TABLET | Freq: Every day | ORAL | 2 refills | Status: DC

## 2019-01-04 NOTE — Telephone Encounter (Signed)
Patient called back and informed him of lab results and per Dr. Kandis Mannan ito increase crestor to 40 mg daily and have labs redrawn in 6 weeks. He verbally understood. No further questions,

## 2019-01-04 NOTE — Addendum Note (Signed)
Addended by: Linna Hoff R on: 01/04/2019 08:30 AM   Modules accepted: Orders

## 2019-01-14 ENCOUNTER — Other Ambulatory Visit: Payer: Self-pay | Admitting: Cardiology

## 2019-01-15 ENCOUNTER — Encounter: Payer: Self-pay | Admitting: Vascular Surgery

## 2019-01-31 ENCOUNTER — Other Ambulatory Visit: Payer: Self-pay | Admitting: Cardiology

## 2019-01-31 MED ORDER — ROSUVASTATIN CALCIUM 20 MG PO TABS: 40.0000 mg | ORAL_TABLET | Freq: Every day | ORAL | 0 refills | Status: DC

## 2019-01-31 NOTE — Telephone Encounter (Signed)
Crestor 40 mg daily refilled.

## 2019-01-31 NOTE — Addendum Note (Signed)
Addended by: Ashok Norris on: 01/31/2019 04:04 PM   Modules accepted: Orders

## 2019-01-31 NOTE — Telephone Encounter (Signed)
Please call in Rouvastatin with the new dosage on it to [facility]

## 2019-02-13 IMAGING — DX DG CHEST 2V
2 series · 2 of 2 positions shown · non-contrast
Comparison: Radiograph May 12, 2013.

CLINICAL DATA: Shortness of breath.

EXAM:
CHEST  2 VIEW

[chest pa]
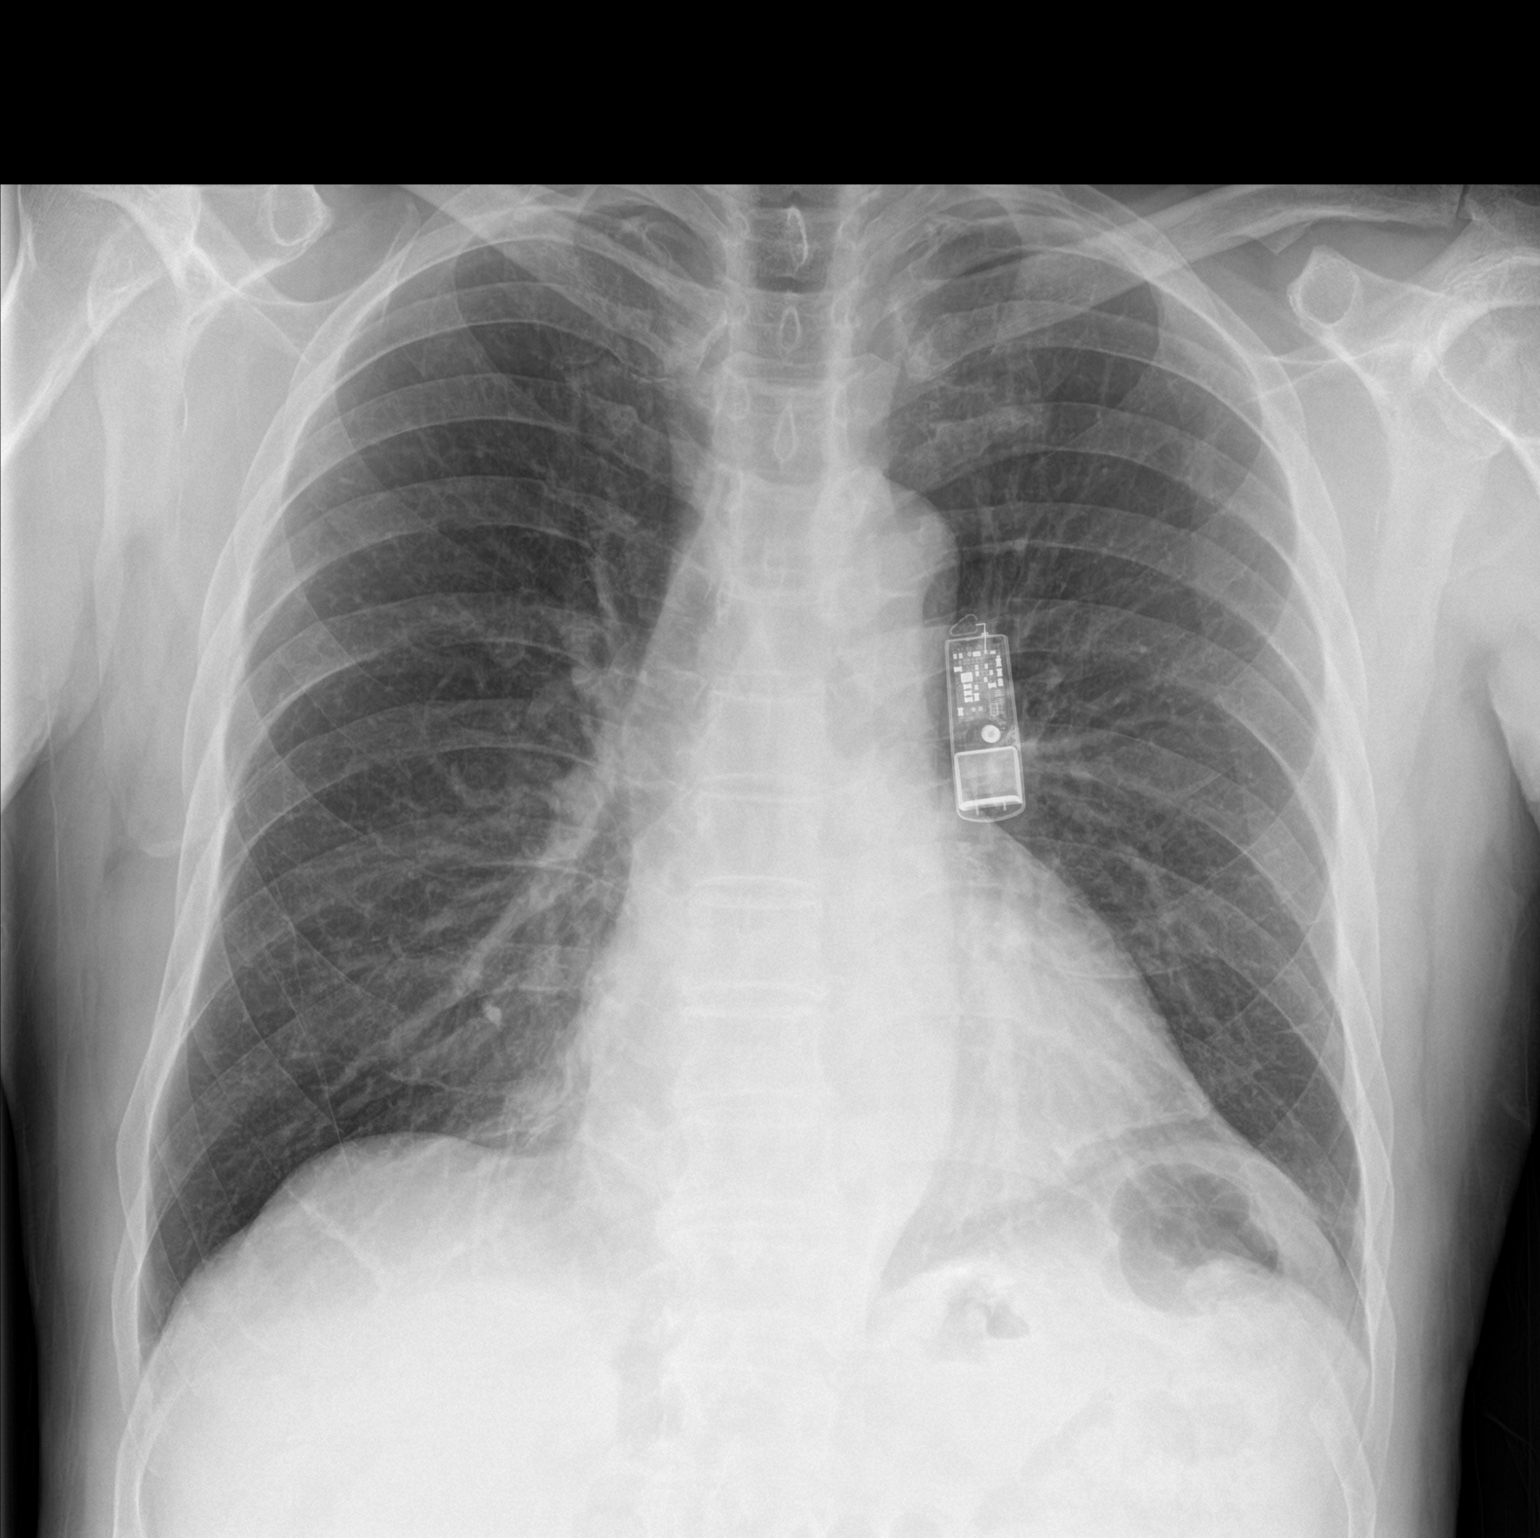

[chest lat]
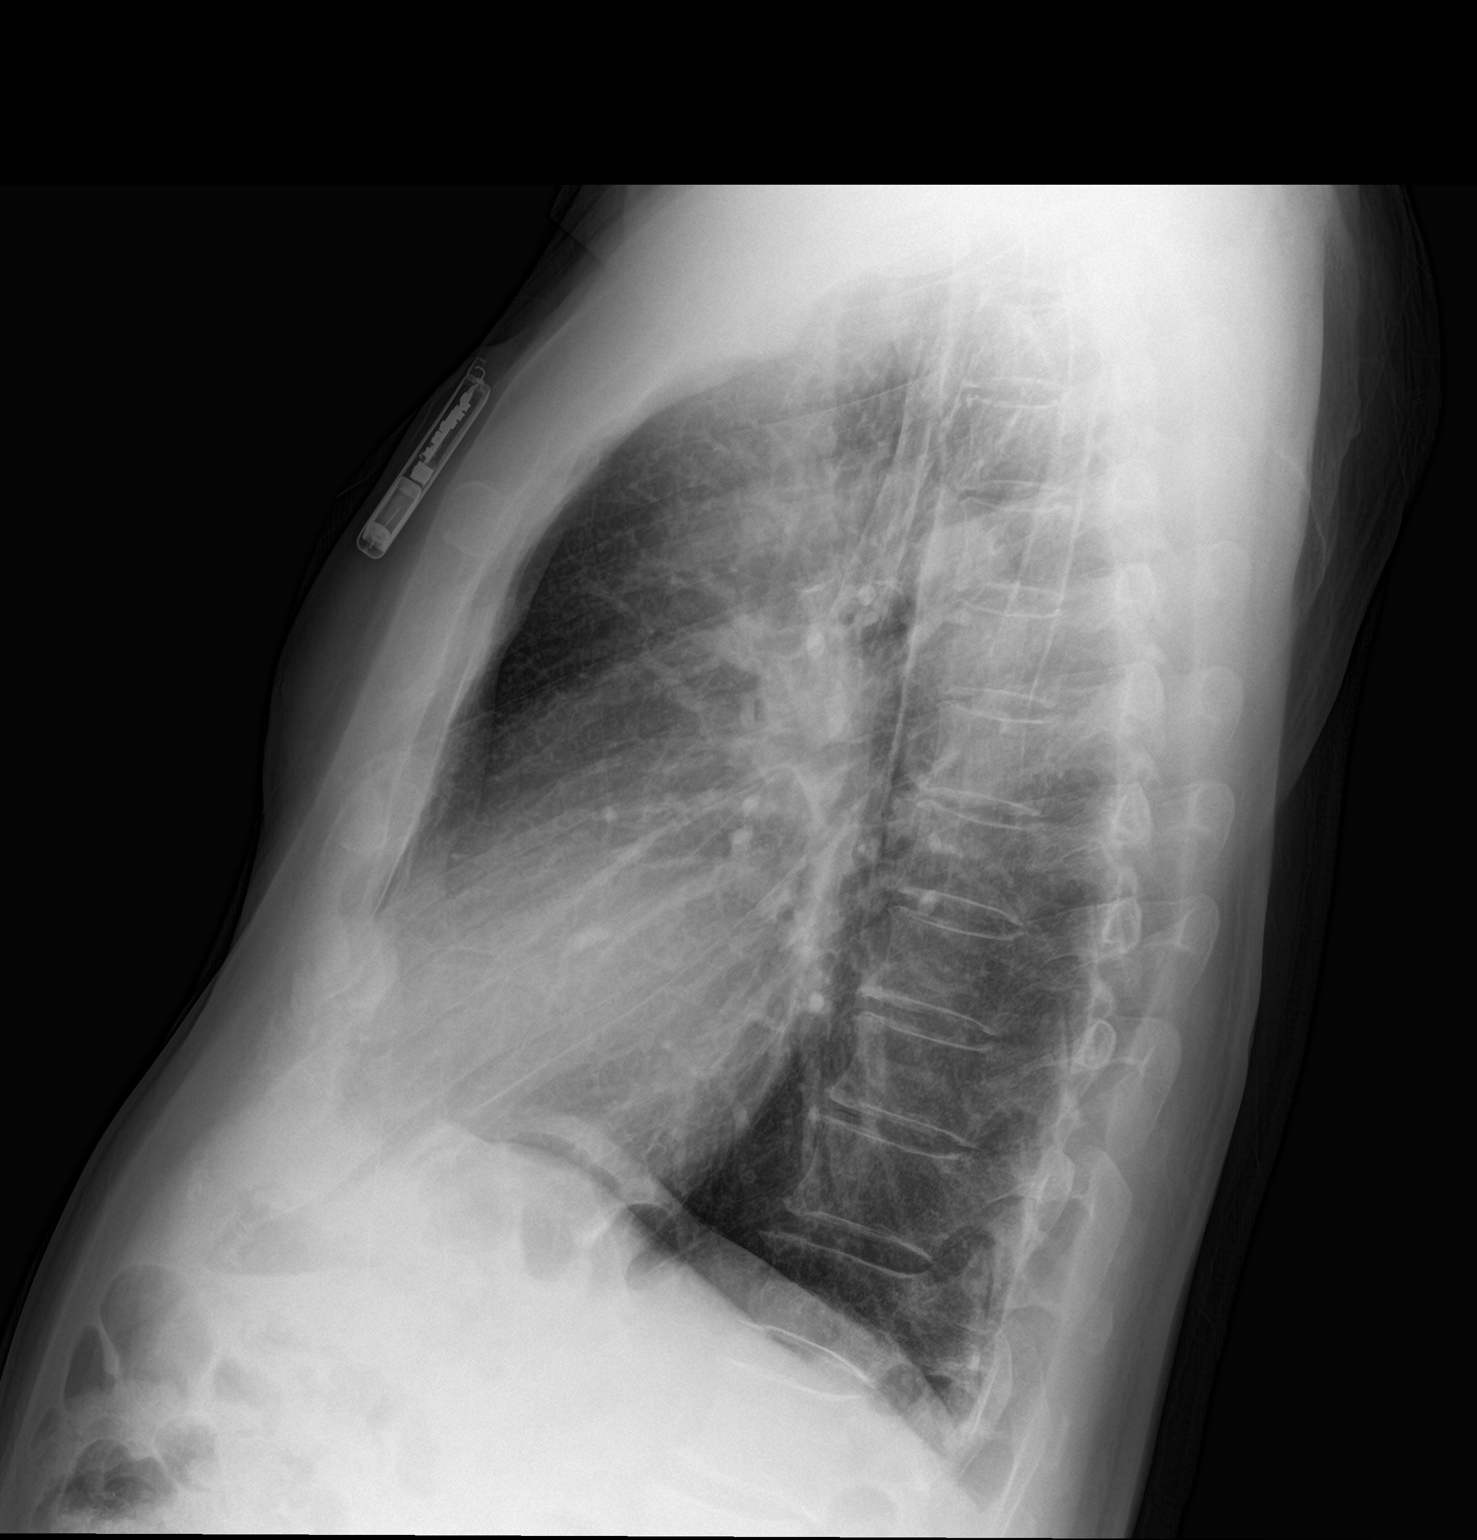

[2 of 2 positions shown; findings below may reference images not displayed]

FINDINGS: The heart size and mediastinal contours are within normal limits.
Both lungs are clear. No pneumothorax or pleural effusion is noted.
The visualized skeletal structures are unremarkable.
IMPRESSION: No active cardiopulmonary disease.

## 2019-02-14 ENCOUNTER — Other Ambulatory Visit: Payer: Self-pay

## 2019-02-14 ENCOUNTER — Ambulatory Visit (INDEPENDENT_AMBULATORY_CARE_PROVIDER_SITE_OTHER): Payer: Medicare Other

## 2019-02-14 DIAGNOSIS — I472 Ventricular tachycardia: Secondary | ICD-10-CM | POA: Diagnosis not present

## 2019-02-14 DIAGNOSIS — E785 Hyperlipidemia, unspecified: Secondary | ICD-10-CM | POA: Diagnosis not present

## 2019-02-14 DIAGNOSIS — R0600 Dyspnea, unspecified: Secondary | ICD-10-CM

## 2019-02-14 DIAGNOSIS — I10 Essential (primary) hypertension: Secondary | ICD-10-CM

## 2019-02-14 MED ORDER — ROSUVASTATIN CALCIUM 40 MG PO TABS: 40.0000 mg | ORAL_TABLET | Freq: Every day | ORAL | 0 refills | Status: DC

## 2019-02-14 NOTE — Addendum Note (Signed)
Addended by: Ashok Norris on: 02/14/2019 05:26 PM   Modules accepted: Orders

## 2019-02-14 NOTE — Progress Notes (Deleted)
Complete echocardiogram has been performed.  Jimmy Mikhia Dusek RDCS, RVT 

## 2019-02-14 NOTE — Telephone Encounter (Signed)
Crestor 40 mg tablet (1 tablet daily ) refilled at [facility].

## 2019-02-14 NOTE — Progress Notes (Signed)
Complete echocardiogram has been performed.  Jimmy Khyler Eschmann RDCS, RVT 

## 2019-02-14 NOTE — Telephone Encounter (Signed)
Follow Up  Patient states that his insurance is only covering 1 tablet a day vs two tablets a day and patient is now completely out of the rosuvastatin now since he was not able to get his refill. Patient is wanting a short supply of the rosuvastatin sent to [id number] - [place], [address] and a new prescription for rosuvastatin (40 mg tablet) sent to [place], [place]. Please assist.

## 2019-02-15 LAB — LIPID PANEL
Chol/HDL Ratio: 4.1 ratio (ref 0.0–5.0)
Cholesterol, Total: 162 mg/dL (ref 100–199)
HDL: 40 mg/dL (ref >39)
LDL Chol Calc (NIH): 91 mg/dL (ref 0–99)
Triglycerides: 180 mg/dL — ABNORMAL HIGH (ref 0–149)
VLDL Cholesterol Cal: 31 mg/dL (ref 5–40)

## 2019-02-15 LAB — AST: AST: 20 IU/L (ref 0–40)

## 2019-02-15 LAB — ALT: ALT: 20 IU/L (ref 0–44)

## 2019-02-19 ENCOUNTER — Telehealth: Payer: Self-pay | Admitting: Emergency Medicine

## 2019-02-19 MED ORDER — EZETIMIBE 10 MG PO TABS: 10.0000 mg | ORAL_TABLET | Freq: Every day | ORAL | 1 refills | Status: DC

## 2019-02-19 NOTE — Telephone Encounter (Signed)
Called patient. Informed him of echo results and lab results. Patient advised to start zetia 10 mg daily per Dr. Fraser Din. No further questions.

## 2019-02-25 ENCOUNTER — Encounter: Payer: Self-pay | Admitting: Cardiology

## 2019-02-25 ENCOUNTER — Other Ambulatory Visit: Payer: Self-pay

## 2019-02-25 ENCOUNTER — Ambulatory Visit (INDEPENDENT_AMBULATORY_CARE_PROVIDER_SITE_OTHER): Payer: Medicare Other | Admitting: Cardiology

## 2019-02-25 VITALS — BP 108/66 | HR 62 | Ht 72.0 in | Wt 190.8 lb

## 2019-02-25 DIAGNOSIS — I2510 Atherosclerotic heart disease of native coronary artery without angina pectoris: Secondary | ICD-10-CM | POA: Diagnosis not present

## 2019-02-25 DIAGNOSIS — R0600 Dyspnea, unspecified: Secondary | ICD-10-CM

## 2019-02-25 DIAGNOSIS — I472 Ventricular tachycardia, unspecified: Secondary | ICD-10-CM

## 2019-02-25 DIAGNOSIS — R0609 Other forms of dyspnea: Secondary | ICD-10-CM

## 2019-02-25 DIAGNOSIS — Z95818 Presence of other cardiac implants and grafts: Secondary | ICD-10-CM

## 2019-02-25 DIAGNOSIS — I209 Angina pectoris, unspecified: Secondary | ICD-10-CM

## 2019-02-25 DIAGNOSIS — I10 Essential (primary) hypertension: Secondary | ICD-10-CM

## 2019-02-25 MED ORDER — RANOLAZINE ER 500 MG PO TB12: 500.0000 mg | ORAL_TABLET | Freq: Two times a day (BID) | ORAL | 1 refills | Status: DC

## 2019-02-25 NOTE — Patient Instructions (Signed)
Medication Instructions:  Your physician has recommended you make the following change in your medication:   START: Ranexa 500 mg Take 1 tab twice daily  *If you need a refill on your cardiac medications before your next appointment, please call your pharmacy*  Lab Work: None If you have labs (blood work) drawn today and your tests are completely normal, you will receive your results only by: [place] MyChart Message (if you have MyChart) OR . A paper copy in the mail If you have any lab test that is abnormal or we need to change your treatment, we will call you to review the results.  Testing/Procedures: None  Follow-Up: At [facility], you and your health needs are our priority.  As part of our continuing mission to provide you with exceptional heart care, we have created designated Provider Care Teams.  These Care Teams include your primary Cardiologist (physician) and Advanced Practice Providers (APPs -  Physician Assistants and Nurse Practitioners) who all work together to provide you with the care you need, when you need it.  Your next appointment:   3 month(s)  The format for your next appointment:   In Person  Provider:   Jenne Campus, MD  Other Instructions Ranolazine tablets, extended release What is this medicine? RANOLAZINE (ra NOE la zeen) is a heart medicine. It is used to treat chronic chest pain (angina). This medicine must be taken regularly. It will not relieve an acute episode of chest pain. This medicine may be used for other purposes; ask your health care provider or pharmacist if you have questions. COMMON BRAND NAME(S): Ranexa What should I tell my health care provider before I take this medicine? They need to know if you have any of these conditions:  heart disease  irregular heartbeat  kidney disease  liver disease  low levels of potassium or magnesium in the blood  an unusual or allergic reaction to ranolazine, other medicines, foods, dyes, or  preservatives  pregnant or trying to get pregnant  breast-feeding How should I use this medicine? Take this medicine by mouth with a glass of water. Follow the directions on the prescription label. Do not cut, crush, or chew this medicine. Take with or without food. Do not take this medication with grapefruit juice. Take your doses at regular intervals. Do not take your medicine more often then directed. Talk to your pediatrician regarding the use of this medicine in children. Special care may be needed. Overdosage: If you think you have taken too much of this medicine contact a poison control center or emergency room at once. NOTE: This medicine is only for you. Do not share this medicine with others. What if I miss a dose? If you miss a dose, take it as soon as you can. If it is almost time for your next dose, take only that dose. Do not take double or extra doses. What may interact with this medicine? Do not take this medicine with any of the following medications:  antivirals for HIV or AIDS  cerivastatin  certain antibiotics like chloramphenicol, clarithromycin, dalfopristin; quinupristin, isoniazid, rifabutin, rifampin, rifapentine  certain medicines used for cancer like imatinib, nilotinib  certain medicines for fungal infections like fluconazole, itraconazole, ketoconazole, posaconazole, voriconazole  certain medicines for irregular heart beat like dronedarone  certain medicines for seizures like carbamazepine, fosphenytoin, oxcarbazepine, phenobarbital, phenytoin  cisapride  conivaptan  cyclosporine  grapefruit or grapefruit juice  lumacaftor; ivacaftor  nefazodone  pimozide  quinacrine  St John's wort  thioridazine  This medicine may also interact with the following medications:  alfuzosin  certain medicines for depression, anxiety, or psychotic disturbances like bupropion, citalopram, fluoxetine, fluphenazine, paroxetine, perphenazine, risperidone,  sertraline, trifluoperazine  certain medicines for cholesterol like atorvastatin, lovastatin, simvastatin  certain medicines for stomach problems like octreotide, palonosetron, prochlorperazine  eplerenone  ergot alkaloids like dihydroergotamine, ergonovine, ergotamine, methylergonovine  metformin  nicardipine  other medicines that prolong the QT interval (cause an abnormal heart rhythm) like dofetilide, ziprasidone  sirolimus  tacrolimus This list may not describe all possible interactions. Give your health care provider a list of all the medicines, herbs, non-prescription drugs, or dietary supplements you use. Also tell them if you smoke, drink alcohol, or use illegal drugs. Some items may interact with your medicine. What should I watch for while using this medicine? Visit your doctor for regular check ups. Tell your doctor or healthcare professional if your symptoms do not start to get better or if they get worse. This medicine will not relieve an acute attack of angina or chest pain. This medicine can change your heart rhythm. Your health care provider may check your heart rhythm by ordering an electrocardiogram (ECG) while you are taking this medicine. You may get drowsy or dizzy. Do not drive, use machinery, or do anything that needs mental alertness until you know how this medicine affects you. Do not stand or sit up quickly, especially if you are an older patient. This reduces the risk of dizzy or fainting spells. Alcohol may interfere with the effect of this medicine. Avoid alcoholic drinks. If you are scheduled for any medical or dental procedure, tell your healthcare provider that you are taking this medicine. This medicine can interact with other medicines used during surgery. What side effects may I notice from receiving this medicine? Side effects that you should report to your doctor or health care professional as soon as possible:  allergic reactions like skin rash,  itching or hives, swelling of the face, lips, or tongue  breathing problems  changes in vision  fast, irregular or pounding heartbeat  feeling faint or lightheaded, falls  low or high blood pressure  numbness or tingling feelings  ringing in the ears  tremor or shakiness  slow heartbeat (fewer than 50 beats per minute)  swelling of the legs or feet Side effects that usually do not require medical attention (report to your doctor or health care professional if they continue or are bothersome):  constipation  drowsy  dry mouth  headache  nausea or vomiting  stomach upset This list may not describe all possible side effects. Call your doctor for medical advice about side effects. You may report side effects to FDA at 1-800-FDA-1088. Where should I keep my medicine? Keep out of the reach of children. Store at room temperature between 15 and 30 degrees C (59 and 86 degrees F). Throw away any unused medicine after the expiration date. NOTE: This sheet is a summary. It may not cover all possible information. If you have questions about this medicine, talk to your doctor, pharmacist, or health care provider.  2020 Elsevier/Gold Standard (2018-01-23 09:18:49)

## 2019-02-25 NOTE — Progress Notes (Signed)
Cardiology Office Note:    Date:  02/25/2019   ID:  Scott Whitehead, DOB [birth year], MRN [id number]  PCP:  Street, Sharon Mt, MD  Cardiologist:  Jenne Campus, MD    Referring MD: [address] Sharon Mt, *   No chief complaint on file. Doing well  History of Present Illness:    Scott Whitehead is a 79 y.o. male with past medical history significant for coronary artery disease, status post stent to LAD many years ago, last cardiac catheterization 2018 showed up to 40% lesions, comes today 2 months for follow-up we will initially said he is doing well but he describes episodes of tightness in the chest will happen when he does extreme exertion look like he got exertional angina pectoralis [place] classification of 1/2.  He does not prevent him from doing what he wants to do.  Recently he did have a stress test done which showed no evidence of ischemia, last cardiac catheterization reviewed from 2018 showing up to 40% blockages.  We talked in length about what to do with the situation.  And we elected to try ranolazine 5 5 mg twice daily to see how he responds to the therapy.  He said that he is somewhat surprised because of the same time he can work very hard cutting chopping woods and have no difficulty doing it.  No chest pain during those times.  Past Medical History:  Diagnosis Date  . Cancer ([place])   . GERD (gastroesophageal reflux disease)   . Hyperlipidemia   . Hypertension     Past Surgical History:  Procedure Laterality Date  . CARDIAC SURGERY     heart monitor is placed in the heart area  . ENDOVENOUS ABLATION SAPHENOUS VEIN W/ LASER Right 03/08/2018   endovenous laser ablation right greater saphenous vein by Deitra Mayo MD   . LEFT HEART CATH AND CORONARY ANGIOGRAPHY N/A 09/13/2016   Procedure: Left Heart Cath and Coronary Angiography;  Surgeon: Leonie Man, MD;  Location: [facility];  Service: Cardiovascular;  Laterality: N/A;  . LOOP RECORDER  IMPLANT    . stab phlebectomy  Right 08/23/2018   stab phlebectomy 10-20 incisions right leg by Chritopher Scot Dock MD     Current Medications: Current Meds  Medication Sig  . aspirin EC 81 MG tablet Take 81 mg by mouth daily.  . diphenhydrAMINE (BENADRYL) 25 MG tablet Take 25 mg by mouth at bedtime as needed for sleep.  Marland Kitchen ezetimibe (ZETIA) 10 MG tablet Take 1 tablet (10 mg total) by mouth daily.  . finasteride (PROSCAR) 5 MG tablet Take 1 tablet by mouth daily.  Marland Kitchen ibuprofen (ADVIL,MOTRIN) 200 MG tablet Take 400 mg by mouth every 8 (eight) hours as needed (for pain.).  Marland Kitchen lisinopril (ZESTRIL) 2.5 MG tablet Take 2.5 mg by mouth daily.  Marland Kitchen MAGNESIUM PO Take by mouth daily.  . Melatonin 10 MG TABS Take 1 tablet by mouth as needed.  . metoprolol tartrate (LOPRESSOR) 25 MG tablet TAKE 1 TABLET BY MOUTH  TWICE DAILY  . Multiple Vitamin (MULTIVITAMIN WITH MINERALS) TABS tablet Take 1 tablet by mouth daily.  . Omega-3 Fatty Acids (FISH OIL) 1000 MG CAPS Take 1,000 mg by mouth 2 (two) times daily.   . pantoprazole (PROTONIX) 40 MG tablet Take 40 mg by mouth daily.  . rosuvastatin (CRESTOR) 40 MG tablet Take 1 tablet (40 mg total) by mouth daily.  . tamsulosin (FLOMAX) 0.4 MG CAPS capsule Take 0.4 mg by mouth daily after supper.  Allergies:   Patient has no known allergies.   Social History   Socioeconomic History  . Marital status: Married    Spouse name: Not on file  . Number of children: Not on file  . Years of education: Not on file  . Highest education level: Not on file  Occupational History  . Not on file  Tobacco Use  . Smoking status: Never Smoker  . Smokeless tobacco: Never Used  Substance and Sexual Activity  . Alcohol use: No  . Drug use: No  . Sexual activity: Not on file  Other Topics Concern  . Not on file  Social History Narrative  . Not on file   Social Determinants of Health   Financial Resource Strain:   . Difficulty of Paying Living Expenses: Not on file    Food Insecurity:   . Worried About Charity fundraiser in the Last Year: Not on file  . Ran Out of Food in the Last Year: Not on file  Transportation Needs:   . Lack of Transportation (Medical): Not on file  . Lack of Transportation (Non-Medical): Not on file  Physical Activity:   . Days of Exercise per Week: Not on file  . Minutes of Exercise per Session: Not on file  Stress:   . Feeling of Stress : Not on file  Social Connections:   . Frequency of Communication with Friends and Family: Not on file  . Frequency of Social Gatherings with Friends and Family: Not on file  . Attends Religious Services: Not on file  . Active Member of Clubs or Organizations: Not on file  . Attends Archivist Meetings: Not on file  . Marital Status: Not on file     Family History: The patient's family history includes Alzheimer's disease in his father; Blindness in his father; Glaucoma in his father; Heart disease in his mother; Hypertension in his father and mother. ROS:   Please see the history of present illness.    All 14 point review of systems negative except as described per history of present illness  EKGs/Labs/Other Studies Reviewed:      Recent Labs: 02/14/2019: ALT 20  Recent Lipid Panel    Component Value Date/Time   CHOL 162 02/14/2019 0849   TRIG 180 (H) 02/14/2019 0849   HDL 40 02/14/2019 0849   CHOLHDL 4.1 02/14/2019 0849   LDLCALC 91 02/14/2019 0849    Physical Exam:    VS:  BP 108/66   Pulse 62   Ht 6' (1.829 m)   Wt 190 lb 12.8 oz (86.5 kg)   BMI 25.88 kg/m     Wt Readings from Last 3 Encounters:  02/25/19 190 lb 12.8 oz (86.5 kg)  01/03/19 188 lb (85.3 kg)  12/24/18 188 lb 12.8 oz (85.6 kg)     GEN:  Well nourished, well developed in no acute distress HEENT: Normal NECK: No JVD; No carotid bruits LYMPHATICS: No lymphadenopathy CARDIAC: RRR, no murmurs, no rubs, no gallops RESPIRATORY:  Clear to auscultation without rales, wheezing or rhonchi   ABDOMEN: Soft, non-tender, non-distended MUSCULOSKELETAL:  No edema; No deformity  SKIN: Warm and dry LOWER EXTREMITIES: no swelling NEUROLOGIC:  Alert and oriented x 3 PSYCHIATRIC:  Normal affect   ASSESSMENT:    1. Angina pectoris ([place])   2. Coronary artery disease involving native coronary artery of native heart without angina pectoris   3. Essential hypertension   4. VT (ventricular tachycardia) ([place])   5. Dyspnea on exertion  6. Status post placement of implantable loop recorder    PLAN:    In order of problems listed above:  1. Angina pectoris will initiate ranolazine 500 mg twice daily to on top of all medications 2. Coronary artery disease with some symptoms right now plan as outlined above 3. Essential hypertension blood pressure well controlled continue present management. 4. History of ventricular tachycardia no syncope no palpitations.  He does have implantable loop recorder who never showed any significant findings.  Loop recorder is already depleted he does not want to remove it. Dyspnea exertion doing well from that point review. Dyslipidemia recently said he has been added to his medical regiment.   Medication Adjustments/Labs and Tests Ordered: Current medicines are reviewed at length with the patient today.  Concerns regarding medicines are outlined above.  No orders of the defined types were placed in this encounter.  Medication changes: No orders of the defined types were placed in this encounter.   Signed, Park Liter, MD, [facility] 02/25/2019 10:45 AM    [place]

## 2019-02-27 ENCOUNTER — Telehealth: Payer: Self-pay | Admitting: Cardiology

## 2019-02-27 ENCOUNTER — Other Ambulatory Visit: Payer: Self-pay | Admitting: *Deleted

## 2019-02-27 MED ORDER — ROSUVASTATIN CALCIUM 40 MG PO TABS: 40.0000 mg | ORAL_TABLET | Freq: Every day | ORAL | 1 refills | Status: DC

## 2019-02-27 NOTE — Telephone Encounter (Signed)
*  STAT* If patient is at the pharmacy, call can be transferred to refill team.   1. Which medications need to be refilled? (please list name of each medication and dose if known) Rouvastatin 40mg  takes 1 daily   2. Which pharmacy/location (including street and city if local pharmacy) is medication to be sent to?[facility]  3. Do they need a 30 day or 90 day supply? [place]

## 2019-02-27 NOTE — Telephone Encounter (Signed)
Refill sent to [facility].  

## 2019-03-09 ENCOUNTER — Other Ambulatory Visit: Payer: Self-pay | Admitting: Cardiology

## 2019-03-16 DIAGNOSIS — E782 Mixed hyperlipidemia: Secondary | ICD-10-CM | POA: Diagnosis not present

## 2019-03-16 DIAGNOSIS — I10 Essential (primary) hypertension: Secondary | ICD-10-CM | POA: Diagnosis not present

## 2019-04-14 DIAGNOSIS — I10 Essential (primary) hypertension: Secondary | ICD-10-CM | POA: Diagnosis not present

## 2019-04-14 DIAGNOSIS — E782 Mixed hyperlipidemia: Secondary | ICD-10-CM | POA: Diagnosis not present

## 2019-04-20 ENCOUNTER — Other Ambulatory Visit: Payer: Self-pay | Admitting: Cardiology

## 2019-05-06 DIAGNOSIS — R739 Hyperglycemia, unspecified: Secondary | ICD-10-CM | POA: Diagnosis not present

## 2019-05-06 DIAGNOSIS — K219 Gastro-esophageal reflux disease without esophagitis: Secondary | ICD-10-CM | POA: Diagnosis not present

## 2019-05-06 DIAGNOSIS — Z0000 Encounter for general adult medical examination without abnormal findings: Secondary | ICD-10-CM | POA: Diagnosis not present

## 2019-05-06 DIAGNOSIS — I10 Essential (primary) hypertension: Secondary | ICD-10-CM | POA: Diagnosis not present

## 2019-05-06 DIAGNOSIS — I2510 Atherosclerotic heart disease of native coronary artery without angina pectoris: Secondary | ICD-10-CM | POA: Diagnosis not present

## 2019-05-06 DIAGNOSIS — Z79899 Other long term (current) drug therapy: Secondary | ICD-10-CM | POA: Diagnosis not present

## 2019-05-06 DIAGNOSIS — E782 Mixed hyperlipidemia: Secondary | ICD-10-CM | POA: Diagnosis not present

## 2019-05-15 DIAGNOSIS — E782 Mixed hyperlipidemia: Secondary | ICD-10-CM | POA: Diagnosis not present

## 2019-05-15 DIAGNOSIS — K219 Gastro-esophageal reflux disease without esophagitis: Secondary | ICD-10-CM | POA: Diagnosis not present

## 2019-05-23 ENCOUNTER — Other Ambulatory Visit: Payer: Self-pay

## 2019-05-27 ENCOUNTER — Ambulatory Visit: Payer: Medicare Other | Admitting: Cardiology

## 2019-05-27 ENCOUNTER — Encounter: Payer: Self-pay | Admitting: Cardiology

## 2019-05-27 ENCOUNTER — Other Ambulatory Visit: Payer: Self-pay

## 2019-05-27 VITALS — BP 112/70 | HR 63 | Temp 97.0°F | Ht 72.0 in | Wt 189.2 lb

## 2019-05-27 DIAGNOSIS — I472 Ventricular tachycardia, unspecified: Secondary | ICD-10-CM

## 2019-05-27 DIAGNOSIS — I10 Essential (primary) hypertension: Secondary | ICD-10-CM | POA: Diagnosis not present

## 2019-05-27 DIAGNOSIS — I2510 Atherosclerotic heart disease of native coronary artery without angina pectoris: Secondary | ICD-10-CM | POA: Diagnosis not present

## 2019-05-27 DIAGNOSIS — R0609 Other forms of dyspnea: Secondary | ICD-10-CM

## 2019-05-27 DIAGNOSIS — R0600 Dyspnea, unspecified: Secondary | ICD-10-CM

## 2019-05-27 DIAGNOSIS — Z9861 Coronary angioplasty status: Secondary | ICD-10-CM

## 2019-05-27 NOTE — Addendum Note (Signed)
Addended by: Ashok Norris on: 05/27/2019 11:41 AM   Modules accepted: Orders

## 2019-05-27 NOTE — Patient Instructions (Signed)
Medication Instructions:  Your physician recommends that you continue on your current medications as directed. Please refer to the Current Medication list given to you today.  *If you need a refill on your cardiac medications before your next appointment, please call your pharmacy*   Lab Work: None If you have labs (blood work) drawn today and your tests are completely normal, you will receive your results only by: . MyChart Message (if you have MyChart) OR . A paper copy in the mail If you have any lab test that is abnormal or we need to change your treatment, we will call you to review the results.   Testing/Procedures: A zio monitor was ordered today. It will remain on for 7 days. You will then return monitor and event diary in provided box. It takes 1-2 weeks for report to be downloaded and returned to us. We will call you with the results. If monitor falls off or has orange flashing light, please call Zio for further instructions.      Follow-Up: At [facility], you and your health needs are our priority.  As part of our continuing mission to provide you with exceptional heart care, we have created designated Provider Care Teams.  These Care Teams include your primary Cardiologist (physician) and Advanced Practice Providers (APPs -  Physician Assistants and Nurse Practitioners) who all work together to provide you with the care you need, when you need it.  We recommend signing up for the patient portal called "MyChart".  Sign up information is provided on this After Visit Summary.  MyChart is used to connect with patients for Virtual Visits (Telemedicine).  Patients are able to view lab/test results, encounter notes, upcoming appointments, etc.  Non-urgent messages can be sent to your provider as well.   To learn more about what you can do with MyChart, go to [email or web address].    Your next appointment:   5 month(s)  The format for your next appointment:   In  Person  Provider:   Robert Krasowski, MD   Other Instructions   

## 2019-05-27 NOTE — Progress Notes (Signed)
Cardiology Office Note:    Date:  05/27/2019   ID:  Scott Whitehead, DOB 1940-05-05, MRN [id number]  PCP:  Street, Sharon Mt, MD  Cardiologist:  Jenne Campus, MD    Referring MD: [address], [place] Mt, *   No chief complaint on file. During better but still some short of breath  History of Present Illness:    Scott Whitehead is a 79 y.o. male   with history of coronary artery disease status post PTCA and stenting done years ago, essential hypertension, history of ventricular tachycardia with work-up being negative, comes today to my office for follow-up recently he went with his grandchildren to [place] he had to climb 275 steps he became severely short of breath when it happened.  He had to stop twice actually.  There was no chest pain, no tightness, no pressure, no burning in the chest otherwise he did well obviously very concerned about this scenario.  Denies having any dizziness or passing out but this is described 2 episodes when he became kind of hot for a split second.  Also reports when he checked his pulse oximetry sometimes he see some irregularities.  Last time I seen him I gave him ranolazine 500 mg he cannot tell me for sure if he is feeling any better but overall he said he can compare himself today with himself year ago nothing changed.  Getting short of breath.  Past Medical History:  Diagnosis Date  . Angina pectoris ([place]) 09/09/2016  . CAD S/P percutaneous coronary angioplasty    Stent to LAD many years ago  . Cancer ([place])   . Coronary artery disease involving native coronary artery 04/02/2015   Cardiac catheterization in the middle of 2018 showing nonobstructive lesion PET patent stent site  . Dizziness 11/09/2016  . Dyspnea on exertion 12/24/2018  . Essential hypertension 04/28/2016  . GERD (gastroesophageal reflux disease)   . High risk medication use 06/05/2015  . Hyperlipidemia   . Hyperlipidemia with target low density lipoprotein (LDL) cholesterol less than  70 mg/dL 09/13/2016  . Hypertension   . Primary central sleep apnea 04/02/2015  . Status post ablation of atrial flutter 04/02/2015  . Status post placement of implantable loop recorder 06/02/2017   Battery already depleted patient does not want to have it removed  . Syncope 04/02/2015  . VT (ventricular tachycardia) ([place]) 04/02/2015    Past Surgical History:  Procedure Laterality Date  . CARDIAC SURGERY     heart monitor is placed in the heart area  . ENDOVENOUS ABLATION SAPHENOUS VEIN W/ LASER Right 03/08/2018   endovenous laser ablation right greater saphenous vein by Deitra Mayo MD   . LEFT HEART CATH AND CORONARY ANGIOGRAPHY N/A 09/13/2016   Procedure: Left Heart Cath and Coronary Angiography;  Surgeon: Leonie Man, MD;  Location: [facility];  Service: Cardiovascular;  Laterality: N/A;  . LOOP RECORDER IMPLANT    . stab phlebectomy  Right 08/23/2018   stab phlebectomy 10-20 incisions right leg by Chritopher Scot Dock MD     Current Medications: Current Meds  Medication Sig  . aspirin EC 81 MG tablet Take 81 mg by mouth daily.  . diphenhydrAMINE (BENADRYL) 25 MG tablet Take 25 mg by mouth at bedtime as needed for sleep.  . finasteride (PROSCAR) 5 MG tablet Take 1 tablet by mouth daily.  Marland Kitchen lisinopril (ZESTRIL) 2.5 MG tablet Take 2.5 mg by mouth daily.  Marland Kitchen MAGNESIUM PO Take by mouth daily.  . Melatonin 10 MG TABS Take  1 tablet by mouth as needed.  . metoprolol tartrate (LOPRESSOR) 25 MG tablet TAKE 1 TABLET BY MOUTH  TWICE DAILY  . Multiple Vitamin (MULTIVITAMIN WITH MINERALS) TABS tablet Take 1 tablet by mouth daily.  Marland Kitchen MYRBETRIQ 25 MG TB24 tablet Take 25 mg by mouth daily.  . Omega-3 Fatty Acids (FISH OIL) 1000 MG CAPS Take 1,000 mg by mouth 2 (two) times daily.   . ranolazine (RANEXA) 500 MG 12 hr tablet Take 1 tablet (500 mg total) by mouth 2 (two) times daily.  . rosuvastatin (CRESTOR) 40 MG tablet TAKE 1 TABLET BY MOUTH EVERY DAY     Allergies:   Patient has  no known allergies.   Social History   Socioeconomic History  . Marital status: Married    Spouse name: Not on file  . Number of children: Not on file  . Years of education: Not on file  . Highest education level: Not on file  Occupational History  . Not on file  Tobacco Use  . Smoking status: Never Smoker  . Smokeless tobacco: Never Used  Substance and Sexual Activity  . Alcohol use: No  . Drug use: No  . Sexual activity: Not on file  Other Topics Concern  . Not on file  Social History Narrative  . Not on file   Social Determinants of Health   Financial Resource Strain:   . Difficulty of Paying Living Expenses:   Food Insecurity:   . Worried About Charity fundraiser in the Last Year:   . Arboriculturist in the Last Year:   Transportation Needs:   . Film/video editor (Medical):   Marland Kitchen Lack of Transportation (Non-Medical):   Physical Activity:   . Days of Exercise per Week:   . Minutes of Exercise per Session:   Stress:   . Feeling of Stress :   Social Connections:   . Frequency of Communication with Friends and Family:   . Frequency of Social Gatherings with Friends and Family:   . Attends Religious Services:   . Active Member of Clubs or Organizations:   . Attends Archivist Meetings:   Marland Kitchen Marital Status:      Family History: The patient's family history includes Alzheimer's disease in his father; Blindness in his father; Glaucoma in his father; Heart disease in his mother; Hypertension in his father and mother. ROS:   Please see the history of present illness.    All 14 point review of systems negative except as described per history of present illness  EKGs/Labs/Other Studies Reviewed:      Recent Labs: 02/14/2019: ALT 20  Recent Lipid Panel    Component Value Date/Time   CHOL 162 02/14/2019 0849   TRIG 180 (H) 02/14/2019 0849   HDL 40 02/14/2019 0849   CHOLHDL 4.1 02/14/2019 0849   LDLCALC 91 02/14/2019 0849    Physical Exam:     VS:  BP 112/70   Pulse 63   Temp (!) 97 F (36.1 C)   Ht 6' (1.829 m)   Wt 189 lb 3.2 oz (85.8 kg)   SpO2 99%   BMI 25.66 kg/m     Wt Readings from Last 3 Encounters:  05/27/19 189 lb 3.2 oz (85.8 kg)  02/25/19 190 lb 12.8 oz (86.5 kg)  01/03/19 188 lb (85.3 kg)     GEN:  Well nourished, well developed in no acute distress HEENT: Normal NECK: No JVD; No carotid bruits LYMPHATICS: No lymphadenopathy CARDIAC:  RRR, no murmurs, no rubs, no gallops RESPIRATORY:  Clear to auscultation without rales, wheezing or rhonchi  ABDOMEN: Soft, non-tender, non-distended MUSCULOSKELETAL:  No edema; No deformity  SKIN: Warm and dry LOWER EXTREMITIES: no swelling NEUROLOGIC:  Alert and oriented x 3 PSYCHIATRIC:  Normal affect   ASSESSMENT:    1. CAD S/P percutaneous coronary angioplasty   2. Coronary artery disease involving native coronary artery of native heart without angina pectoris   3. Essential hypertension   4. VT (ventricular tachycardia) ([place])   5. Dyspnea on exertion    PLAN:    In order of problems listed above:  1. Coronary artery disease, last stress test negative, he does have some exertional chest pain with very extreme exertion.  Nothing recently.  Treated with medications for now. 2. Essential hypertension blood pressure appears to be well controlled.  He showed me blood pressure measurement he did within last few weeks and does acceptable.  We will continue present management. 3. History of ventricular tachycardia no recent episode of syncope.  But reported to have some extrasystole noted on the pulse oximeter.  I will ask him to wear Zio patch for a week to see what palpitation he has and how much. 4. Dyspnea on exertion chronic problem now worse.  Again will wait for monitor to be back and then decide about next that he may require echocardiogram to assess ejection fraction again   Medication Adjustments/Labs and Tests Ordered: Current medicines are reviewed at  length with the patient today.  Concerns regarding medicines are outlined above.  No orders of the defined types were placed in this encounter.  Medication changes: No orders of the defined types were placed in this encounter.   Signed, Park Liter, MD, [facility] 05/27/2019 11:29 AM    [place]

## 2019-05-30 ENCOUNTER — Ambulatory Visit (INDEPENDENT_AMBULATORY_CARE_PROVIDER_SITE_OTHER): Payer: Medicare Other

## 2019-05-30 DIAGNOSIS — I472 Ventricular tachycardia: Secondary | ICD-10-CM | POA: Diagnosis not present

## 2019-06-24 DIAGNOSIS — I472 Ventricular tachycardia: Secondary | ICD-10-CM | POA: Diagnosis not present

## 2019-06-27 ENCOUNTER — Other Ambulatory Visit: Payer: Self-pay | Admitting: Cardiology

## 2019-06-29 ENCOUNTER — Other Ambulatory Visit: Payer: Self-pay | Admitting: Cardiology

## 2019-07-04 ENCOUNTER — Telehealth: Payer: Self-pay | Admitting: *Deleted

## 2019-07-04 ENCOUNTER — Telehealth: Payer: Self-pay | Admitting: Cardiology

## 2019-07-04 NOTE — Telephone Encounter (Signed)
Pt would like the results of his monitor as soon as possible please. Call his home number and if no one answers can leave message per pt.

## 2019-07-04 NOTE — Telephone Encounter (Signed)
Holter monitor is reviewed, he does have some runs of nonsustained ventricular tachycardia this is the same what was noted in 2018, knowing the fact that his ejection fraction is normal as well as the fact that he stress test recently was normal.  We will continue medical therapy.

## 2019-07-04 NOTE — Telephone Encounter (Signed)
Spoke with patient regarding results and recommendation.  Patient verbalizes understanding and is agreeable to plan of care. Advised patient to call back with any issues or concerns.  

## 2019-07-04 NOTE — Telephone Encounter (Signed)
New Message  The pt called and he wants to know the results from the monitor that he had to wear for a week

## 2019-07-24 ENCOUNTER — Other Ambulatory Visit: Payer: Self-pay

## 2019-07-25 ENCOUNTER — Other Ambulatory Visit: Payer: Self-pay

## 2019-07-25 ENCOUNTER — Ambulatory Visit: Payer: Medicare Other | Admitting: Sports Medicine

## 2019-07-25 ENCOUNTER — Encounter: Payer: Self-pay | Admitting: Sports Medicine

## 2019-07-25 DIAGNOSIS — Q828 Other specified congenital malformations of skin: Secondary | ICD-10-CM

## 2019-07-25 DIAGNOSIS — M79672 Pain in left foot: Secondary | ICD-10-CM

## 2019-07-25 NOTE — Progress Notes (Signed)
Subjective: Scott Whitehead is a 79 y.o. male patient who presents to office for evaluation of  Left foot pain secondary to callus skin. Patient complains of pain at the lesion sub met 4 left. Patient has tried change of shoes wiith no relief in symptoms. Patient denies any other pedal complaints.  Review of Systems  All other systems reviewed and are negative.    Patient Active Problem List   Diagnosis Date Noted  . Dyspnea on exertion 12/24/2018  . Status post placement of implantable loop recorder 06/02/2017  . Dizziness 11/09/2016  . Hyperlipidemia with target low density lipoprotein (LDL) cholesterol less than 70 mg/dL 09/13/2016  . CAD S/P percutaneous coronary angioplasty   . Angina pectoris ([place]) 09/09/2016  . Essential hypertension 04/28/2016  . High risk medication use 06/05/2015  . Coronary artery disease involving native coronary artery 04/02/2015  . Primary central sleep apnea 04/02/2015  . Status post ablation of atrial flutter 04/02/2015  . Syncope 04/02/2015  . VT (ventricular tachycardia) ([place]) 04/02/2015    Current Outpatient Medications on File Prior to Visit  Medication Sig Dispense Refill  . aspirin EC 81 MG tablet Take 81 mg by mouth daily.    . diphenhydrAMINE (BENADRYL) 25 MG tablet Take 25 mg by mouth at bedtime as needed for sleep.    Marland Kitchen ezetimibe (ZETIA) 10 MG tablet TAKE 1 TABLET BY MOUTH  DAILY 90 tablet 3  . finasteride (PROSCAR) 5 MG tablet Take 1 tablet by mouth daily.    Marland Kitchen lisinopril (ZESTRIL) 2.5 MG tablet Take 2.5 mg by mouth daily.    Marland Kitchen MAGNESIUM PO Take by mouth daily.    . Melatonin 10 MG TABS Take 1 tablet by mouth as needed.    . metoprolol tartrate (LOPRESSOR) 25 MG tablet TAKE 1 TABLET BY MOUTH  TWICE DAILY 180 tablet 3  . Multiple Vitamin (MULTIVITAMIN WITH MINERALS) TABS tablet Take 1 tablet by mouth daily.    Marland Kitchen MYRBETRIQ 25 MG TB24 tablet Take 25 mg by mouth daily.    . nitroGLYCERIN (NITROSTAT) 0.4 MG SL tablet Place 1 tablet (0.4 mg  total) under the tongue every 5 (five) minutes as needed for chest pain. 25 tablet 11  . Omega-3 Fatty Acids (FISH OIL) 1000 MG CAPS Take 1,000 mg by mouth 2 (two) times daily.     . ranolazine (RANEXA) 500 MG 12 hr tablet TAKE 1 TABLET BY MOUTH  TWICE DAILY 180 tablet 2  . rosuvastatin (CRESTOR) 40 MG tablet TAKE 1 TABLET BY MOUTH EVERY DAY 30 tablet 4   No current facility-administered medications on file prior to visit.    No Known Allergies  Objective:  General: Alert and oriented x3 in no acute distress  Dermatology: Keratotic lesion present sub met 4 left foot with skin lines transversing the lesion, pain is present with direct pressure to the lesion with a central nucleated core noted, no webspace macerations, no ecchymosis bilateral, all nails x 10 are well manicured.  Vascular: Dorsalis Pedis and Posterior Tibial pedal pulses 1/4, Capillary Fill Time 3 seconds, + pedal hair growth bilateral, no edema bilateral lower extremities, Temperature gradient within normal limits.  Neurology: Johney Maine sensation intact via light touch bilateral.  Musculoskeletal: Mild tenderness with palpation at the keratotic lesion site on Left, Prominent met head on left,  Muscular strength 5/5 in all groups without pain or limitation on range of motion.   Assessment and Plan: Problem List Items Addressed This Visit    None  Visit Diagnoses    Porokeratosis    -  Primary   Left foot pain          -Complete examination performed -Discussed treatment options -Parred keratoic lesion using a chisel blade; treated the area withSalinocaine covered with bandaid  -Encouraged daily skin emollients, foot miracle sample provided -Advised good supportive shoes and inserts with offloading insoles  -Patient to return to office as needed or sooner if condition worsens.  Landis Martins, DPM

## 2019-07-29 ENCOUNTER — Other Ambulatory Visit: Payer: Self-pay | Admitting: Cardiology

## 2019-08-07 DIAGNOSIS — J329 Chronic sinusitis, unspecified: Secondary | ICD-10-CM | POA: Diagnosis not present

## 2019-08-07 DIAGNOSIS — J40 Bronchitis, not specified as acute or chronic: Secondary | ICD-10-CM | POA: Diagnosis not present

## 2019-08-14 DIAGNOSIS — E782 Mixed hyperlipidemia: Secondary | ICD-10-CM | POA: Diagnosis not present

## 2019-08-14 DIAGNOSIS — I10 Essential (primary) hypertension: Secondary | ICD-10-CM | POA: Diagnosis not present

## 2019-09-03 ENCOUNTER — Other Ambulatory Visit: Payer: Self-pay | Admitting: Cardiology

## 2019-09-03 MED ORDER — ROSUVASTATIN CALCIUM 40 MG PO TABS: 40.0000 mg | ORAL_TABLET | Freq: Every day | ORAL | 3 refills | Status: DC

## 2019-09-03 NOTE — Telephone Encounter (Signed)
Refill sent in per request.  

## 2019-09-03 NOTE — Telephone Encounter (Signed)
*  STAT* If patient is at the pharmacy, call can be transferred to refill team.   1. Which medications need to be refilled? (please list name of each medication and dose if known) rosuvastatin (CRESTOR) 40 MG tablet   2. Which pharmacy/location (including street and city if local pharmacy) is medication to be sent to?  [place], [place] [employer], [address]  3. Do they need a 30 day or 90 day supply? [place]

## 2019-10-15 DIAGNOSIS — K219 Gastro-esophageal reflux disease without esophagitis: Secondary | ICD-10-CM | POA: Diagnosis not present

## 2019-10-15 DIAGNOSIS — I10 Essential (primary) hypertension: Secondary | ICD-10-CM | POA: Diagnosis not present

## 2019-10-15 DIAGNOSIS — E782 Mixed hyperlipidemia: Secondary | ICD-10-CM | POA: Diagnosis not present

## 2019-12-03 ENCOUNTER — Other Ambulatory Visit: Payer: Self-pay

## 2019-12-04 ENCOUNTER — Other Ambulatory Visit: Payer: Self-pay

## 2019-12-04 ENCOUNTER — Ambulatory Visit: Payer: Medicare Other | Admitting: Cardiology

## 2019-12-04 ENCOUNTER — Encounter: Payer: Self-pay | Admitting: Cardiology

## 2019-12-04 VITALS — BP 124/52 | HR 65 | Ht 72.0 in | Wt 186.4 lb

## 2019-12-04 DIAGNOSIS — I472 Ventricular tachycardia, unspecified: Secondary | ICD-10-CM

## 2019-12-04 DIAGNOSIS — I2510 Atherosclerotic heart disease of native coronary artery without angina pectoris: Secondary | ICD-10-CM | POA: Diagnosis not present

## 2019-12-04 DIAGNOSIS — Z9861 Coronary angioplasty status: Secondary | ICD-10-CM

## 2019-12-04 DIAGNOSIS — I10 Essential (primary) hypertension: Secondary | ICD-10-CM | POA: Diagnosis not present

## 2019-12-04 DIAGNOSIS — R42 Dizziness and giddiness: Secondary | ICD-10-CM | POA: Diagnosis not present

## 2019-12-04 NOTE — Patient Instructions (Signed)
Medication Instructions:  °Your physician recommends that you continue on your current medications as directed. Please refer to the Current Medication list given to you today. ° °*If you need a refill on your cardiac medications before your next appointment, please call your pharmacy* ° ° °Lab Work: °None. ° °If you have labs (blood work) drawn today and your tests are completely normal, you will receive your results only by: °• MyChart Message (if you have MyChart) OR °• A paper copy in the mail °If you have any lab test that is abnormal or we need to change your treatment, we will call you to review the results. ° ° °Testing/Procedures: °Your physician has requested that you have an echocardiogram. Echocardiography is a painless test that uses sound waves to create images of your heart. It provides your doctor with information about the size and shape of your heart and how well your heart’s chambers and valves are working. This procedure takes approximately one hour. There are no restrictions for this procedure. ° ° ° ° °Follow-Up: °At [facility], you and your health needs are our priority.  As part of our continuing mission to provide you with exceptional heart care, we have created designated Provider Care Teams.  These Care Teams include your primary Cardiologist (physician) and Advanced Practice Providers (APPs -  Physician Assistants and Nurse Practitioners) who all work together to provide you with the care you need, when you need it. ° °We recommend signing up for the patient portal called "MyChart".  Sign up information is provided on this After Visit Summary.  MyChart is used to connect with patients for Virtual Visits (Telemedicine).  Patients are able to view lab/test results, encounter notes, upcoming appointments, etc.  Non-urgent messages can be sent to your provider as well.   °To learn more about what you can do with MyChart, go to [email or web address].com.   ° °Your next appointment:   °6  month(s) ° °The format for your next appointment:   °In Person ° °Provider:   °Robert Krasowski, MD ° ° °Other Instructions ° ° °Echocardiogram °An echocardiogram is a procedure that uses painless sound waves (ultrasound) to produce an image of the heart. Images from an echocardiogram can provide important information about: °· Signs of coronary artery disease (CAD). °· Aneurysm detection. An aneurysm is a weak or damaged part of an artery wall that bulges out from the normal force of blood pumping through the body. °· Heart size and shape. Changes in the size or shape of the heart can be associated with certain conditions, including heart failure, aneurysm, and CAD. °· Heart muscle function. °· Heart valve function. °· Signs of a past heart attack. °· Fluid buildup around the heart. °· Thickening of the heart muscle. °· A tumor or infectious growth around the heart valves. °Tell a health care provider about: °· Any allergies you have. °· All medicines you are taking, including vitamins, herbs, eye drops, creams, and over-the-counter medicines. °· Any blood disorders you have. °· Any surgeries you have had. °· Any medical conditions you have. °· Whether you are pregnant or may be pregnant. °What are the risks? °Generally, this is a safe procedure. However, problems may occur, including: °· Allergic reaction to dye (contrast) that may be used during the procedure. °What happens before the procedure? °No specific preparation is needed. You may eat and drink normally. °What happens during the procedure? ° °· An IV tube may be inserted into one of your veins. °· You may   receive contrast through this tube. A contrast is an injection that improves the quality of the pictures from your heart. °· A gel will be applied to your chest. °· A wand-like tool (transducer) will be moved over your chest. The gel will help to transmit the sound waves from the transducer. °· The sound waves will harmlessly bounce off of your heart to  allow the heart images to be captured in real-time motion. The images will be recorded on a computer. °The procedure may vary among health care providers and hospitals. °What happens after the procedure? °· You may return to your normal, everyday life, including diet, activities, and medicines, unless your health care provider tells you not to do that. °Summary °· An echocardiogram is a procedure that uses painless sound waves (ultrasound) to produce an image of the heart. °· Images from an echocardiogram can provide important information about the size and shape of your heart, heart muscle function, heart valve function, and fluid buildup around your heart. °· You do not need to do anything to prepare before this procedure. You may eat and drink normally. °· After the echocardiogram is completed, you may return to your normal, everyday life, unless your health care provider tells you not to do that. °This information is not intended to replace advice given to you by your health care provider. Make sure you discuss any questions you have with your health care provider. °Document Revised: 05/24/2018 Document Reviewed: 03/05/2016 °Elsevier Patient Education © 2020 [employer] Inc[employer] ° ° °

## 2019-12-04 NOTE — Progress Notes (Signed)
Cardiology Office Note:    Date:  12/04/2019   ID:  Scott Whitehead, DOB 29-Jun-1940, MRN [id number]  PCP:  Street, [place], MD  Cardiologist:  Jenne Campus, MD    Referring MD: [address] [place] Mt, *   No chief complaint on file. Doing fine  History of Present Illness:    Scott Whitehead is a 79 y.o. male with past medical history significant for coronary artery disease status post PTCA and stenting done many years ago, also essential hypertension, history of ventricular tachycardia initially discovered in 2018.  Since that time he had multiple evaluations for stratification including stress test and echocardiogram to [place] were negative.  He is doing well he said he can do whatever he wants to do except running he said.  Does not exercise on the regular basis but she is busy taking care of his household.  Described to have some episode of dizziness very short lasting like before.  No worsening no passing out.  No chest pain tightness squeezing pressure burning chest  Past Medical History:  Diagnosis Date  . Angina pectoris ([place]) 09/09/2016  . CAD S/P percutaneous coronary angioplasty    Stent to LAD many years ago  . Cancer ([place])   . Coronary artery disease involving native coronary artery 04/02/2015   Cardiac catheterization in the middle of 2018 showing nonobstructive lesion PET patent stent site  . Dizziness 11/09/2016  . Dyspnea on exertion 12/24/2018  . Essential hypertension 04/28/2016  . GERD (gastroesophageal reflux disease)   . High risk medication use 06/05/2015  . Hyperlipidemia   . Hyperlipidemia with target low density lipoprotein (LDL) cholesterol less than 70 mg/dL 09/13/2016  . Hypertension   . Primary central sleep apnea 04/02/2015  . Status post ablation of atrial flutter 04/02/2015  . Status post placement of implantable loop recorder 06/02/2017   Battery already depleted patient does not want to have it removed  . Syncope 04/02/2015  . VT (ventricular  tachycardia) ([place]) 04/02/2015    Past Surgical History:  Procedure Laterality Date  . CARDIAC SURGERY     heart monitor is placed in the heart area  . ENDOVENOUS ABLATION SAPHENOUS VEIN W/ LASER Right 03/08/2018   endovenous laser ablation right greater saphenous vein by Deitra Mayo MD   . LEFT HEART CATH AND CORONARY ANGIOGRAPHY N/A 09/13/2016   Procedure: Left Heart Cath and Coronary Angiography;  Surgeon: Leonie Man, MD;  Location: [place] [facility];  Service: Cardiovascular;  Laterality: N/A;  . LOOP RECORDER IMPLANT    . stab phlebectomy  Right 08/23/2018   stab phlebectomy 10-20 incisions right leg by Chritopher Scot Dock MD     Current Medications: Current Meds  Medication Sig  . aspirin EC 81 MG tablet Take 81 mg by mouth daily.  . benzonatate (TESSALON) 200 MG capsule Take 200 mg by mouth 3 (three) times daily as needed.  . cefdinir (OMNICEF) 300 MG capsule Take 300 mg by mouth 2 (two) times daily.  . diphenhydrAMINE (BENADRYL) 25 MG tablet Take 25 mg by mouth at bedtime as needed for sleep.  Marland Kitchen ezetimibe (ZETIA) 10 MG tablet TAKE 1 TABLET BY MOUTH  DAILY  . finasteride (PROSCAR) 5 MG tablet Take 1 tablet by mouth daily.  Marland Kitchen lisinopril (ZESTRIL) 2.5 MG tablet Take 2.5 mg by mouth daily.  Marland Kitchen MAGNESIUM PO Take by mouth daily.  . Melatonin 10 MG TABS Take 1 tablet by mouth as needed.  . metoprolol tartrate (LOPRESSOR) 25 MG tablet TAKE 1  TABLET BY MOUTH  TWICE DAILY  . Multiple Vitamin (MULTIVITAMIN WITH MINERALS) TABS tablet Take 1 tablet by mouth daily.  Marland Kitchen MYRBETRIQ 50 MG TB24 tablet Take 50 mg by mouth daily.  . Omega-3 Fatty Acids (FISH OIL) 1000 MG CAPS Take 1,000 mg by mouth 2 (two) times daily.   . ranolazine (RANEXA) 500 MG 12 hr tablet TAKE 1 TABLET BY MOUTH  TWICE DAILY  . rosuvastatin (CRESTOR) 40 MG tablet Take 1 tablet (40 mg total) by mouth daily.  . tamsulosin (FLOMAX) 0.4 MG CAPS capsule Take 0.4 mg by mouth daily.     Allergies:   Patient has no  known allergies.   Social History   Socioeconomic History  . Marital status: Married    Spouse name: Not on file  . Number of children: Not on file  . Years of education: Not on file  . Highest education level: Not on file  Occupational History  . Not on file  Tobacco Use  . Smoking status: Never Smoker  . Smokeless tobacco: Never Used  Vaping Use  . Vaping Use: Never used  Substance and Sexual Activity  . Alcohol use: No  . Drug use: No  . Sexual activity: Not on file  Other Topics Concern  . Not on file  Social History Narrative  . Not on file   Social Determinants of Health   Financial Resource Strain:   . Difficulty of Paying Living Expenses: Not on file  Food Insecurity:   . Worried About Charity fundraiser in the Last Year: Not on file  . Ran Out of Food in the Last Year: Not on file  Transportation Needs:   . Lack of Transportation (Medical): Not on file  . Lack of Transportation (Non-Medical): Not on file  Physical Activity:   . Days of Exercise per Week: Not on file  . Minutes of Exercise per Session: Not on file  Stress:   . Feeling of Stress : Not on file  Social Connections:   . Frequency of Communication with Friends and Family: Not on file  . Frequency of Social Gatherings with Friends and Family: Not on file  . Attends Religious Services: Not on file  . Active Member of Clubs or Organizations: Not on file  . Attends Archivist Meetings: Not on file  . Marital Status: Not on file     Family History: The patient's family history includes Alzheimer's disease in his father; Blindness in his father; Glaucoma in his father; Heart disease in his mother; Hypertension in his father and mother. ROS:   Please see the history of present illness.    All 14 point review of systems negative except as described per history of present illness  EKGs/Labs/Other Studies Reviewed:      Recent Labs: 02/14/2019: ALT 20  Recent Lipid Panel      Component Value Date/Time   CHOL 162 02/14/2019 0849   TRIG 180 (H) 02/14/2019 0849   HDL 40 02/14/2019 0849   CHOLHDL 4.1 02/14/2019 0849   LDLCALC 91 02/14/2019 0849    Physical Exam:    VS:  BP (!) 124/52   Pulse 65   Ht 6' (1.829 m)   Wt 186 lb 6.4 oz (84.6 kg)   SpO2 97%   BMI 25.28 kg/m     Wt Readings from Last 3 Encounters:  12/04/19 186 lb 6.4 oz (84.6 kg)  05/27/19 189 lb 3.2 oz (85.8 kg)  02/25/19 190 lb 12.8  oz (86.5 kg)     GEN:  Well nourished, well developed in no acute distress HEENT: Normal NECK: No JVD; No carotid bruits LYMPHATICS: No lymphadenopathy CARDIAC: RRR, no murmurs, no rubs, no gallops RESPIRATORY:  Clear to auscultation without rales, wheezing or rhonchi  ABDOMEN: Soft, non-tender, non-distended MUSCULOSKELETAL:  No edema; No deformity  SKIN: Warm and dry LOWER EXTREMITIES: no swelling NEUROLOGIC:  Alert and oriented x 3 PSYCHIATRIC:  Normal affect   ASSESSMENT:    1. CAD S/P percutaneous coronary angioplasty   2. Essential hypertension   3. Coronary artery disease involving native coronary artery of native heart without angina pectoris   4. VT (ventricular tachycardia) ([place])   5. Dizziness    PLAN:    In order of problems listed above:  1. Coronary artery disease status post PTCA and stenting years ago doing well from that point review asymptomatic continue present management. 2. Essential hypertension he brought blood pressure measurements for me from home dose are good we will continue present management. 3. History of ventricular tachycardia no dizziness no passing out his ejection fraction last time was normal echocardiogram proved that, stress test showed no evidence of ischemia.  I will schedule him to have another echocardiogram done. 4. Dizziness no correlation between dizziness and arrhythmia on the monitor that he wear.   Medication Adjustments/Labs and Tests Ordered: Current medicines are reviewed at length with the  patient today.  Concerns regarding medicines are outlined above.  No orders of the defined types were placed in this encounter.  Medication changes: No orders of the defined types were placed in this encounter.   Signed, Park Liter, MD, [facility] 12/04/2019 11:06 AM    [place]

## 2019-12-04 NOTE — Addendum Note (Signed)
Addended by: Senaida Ores on: 12/04/2019 11:10 AM   Modules accepted: Orders

## 2019-12-19 DIAGNOSIS — L570 Actinic keratosis: Secondary | ICD-10-CM | POA: Diagnosis not present

## 2019-12-19 DIAGNOSIS — C4442 Squamous cell carcinoma of skin of scalp and neck: Secondary | ICD-10-CM | POA: Diagnosis not present

## 2019-12-19 DIAGNOSIS — L814 Other melanin hyperpigmentation: Secondary | ICD-10-CM | POA: Diagnosis not present

## 2019-12-19 DIAGNOSIS — D225 Melanocytic nevi of trunk: Secondary | ICD-10-CM | POA: Diagnosis not present

## 2020-01-01 DIAGNOSIS — D044 Carcinoma in situ of skin of scalp and neck: Secondary | ICD-10-CM | POA: Diagnosis not present

## 2020-01-29 ENCOUNTER — Other Ambulatory Visit: Payer: Self-pay

## 2020-01-29 ENCOUNTER — Ambulatory Visit (INDEPENDENT_AMBULATORY_CARE_PROVIDER_SITE_OTHER): Payer: Medicare Other

## 2020-01-29 DIAGNOSIS — I2510 Atherosclerotic heart disease of native coronary artery without angina pectoris: Secondary | ICD-10-CM | POA: Diagnosis not present

## 2020-01-29 DIAGNOSIS — Z9861 Coronary angioplasty status: Secondary | ICD-10-CM

## 2020-01-29 LAB — ECHOCARDIOGRAM COMPLETE
Area-P 1/2: 3.65 cm2
S' Lateral: 3.3 cm

## 2020-02-26 DIAGNOSIS — L570 Actinic keratosis: Secondary | ICD-10-CM | POA: Diagnosis not present

## 2020-02-26 DIAGNOSIS — D044 Carcinoma in situ of skin of scalp and neck: Secondary | ICD-10-CM | POA: Diagnosis not present

## 2020-03-18 ENCOUNTER — Other Ambulatory Visit: Payer: Self-pay | Admitting: Cardiology

## 2020-03-19 NOTE — Telephone Encounter (Signed)
Refill sent to pharmacy.   

## 2020-03-24 ENCOUNTER — Telehealth: Payer: Self-pay | Admitting: Cardiology

## 2020-03-24 NOTE — Telephone Encounter (Signed)
Left message for patient to call back to schedule 6 month recall.

## 2020-04-17 DIAGNOSIS — J40 Bronchitis, not specified as acute or chronic: Secondary | ICD-10-CM | POA: Diagnosis not present

## 2020-04-17 DIAGNOSIS — J329 Chronic sinusitis, unspecified: Secondary | ICD-10-CM | POA: Diagnosis not present

## 2020-05-08 DIAGNOSIS — Z0000 Encounter for general adult medical examination without abnormal findings: Secondary | ICD-10-CM | POA: Diagnosis not present

## 2020-05-08 DIAGNOSIS — D692 Other nonthrombocytopenic purpura: Secondary | ICD-10-CM | POA: Diagnosis not present

## 2020-05-08 DIAGNOSIS — Z79899 Other long term (current) drug therapy: Secondary | ICD-10-CM | POA: Diagnosis not present

## 2020-05-08 DIAGNOSIS — N138 Other obstructive and reflux uropathy: Secondary | ICD-10-CM | POA: Diagnosis not present

## 2020-05-08 DIAGNOSIS — R7302 Impaired glucose tolerance (oral): Secondary | ICD-10-CM | POA: Diagnosis not present

## 2020-05-08 DIAGNOSIS — K2960 Other gastritis without bleeding: Secondary | ICD-10-CM | POA: Diagnosis not present

## 2020-05-08 DIAGNOSIS — I25119 Atherosclerotic heart disease of native coronary artery with unspecified angina pectoris: Secondary | ICD-10-CM | POA: Diagnosis not present

## 2020-05-08 DIAGNOSIS — E782 Mixed hyperlipidemia: Secondary | ICD-10-CM | POA: Diagnosis not present

## 2020-05-08 DIAGNOSIS — I8390 Asymptomatic varicose veins of unspecified lower extremity: Secondary | ICD-10-CM | POA: Diagnosis not present

## 2020-05-08 DIAGNOSIS — N3281 Overactive bladder: Secondary | ICD-10-CM | POA: Diagnosis not present

## 2020-05-08 DIAGNOSIS — I10 Essential (primary) hypertension: Secondary | ICD-10-CM | POA: Diagnosis not present

## 2020-05-14 DIAGNOSIS — H2513 Age-related nuclear cataract, bilateral: Secondary | ICD-10-CM | POA: Diagnosis not present

## 2020-05-14 DIAGNOSIS — H18413 Arcus senilis, bilateral: Secondary | ICD-10-CM | POA: Diagnosis not present

## 2020-05-14 DIAGNOSIS — H2512 Age-related nuclear cataract, left eye: Secondary | ICD-10-CM | POA: Diagnosis not present

## 2020-05-14 DIAGNOSIS — H40013 Open angle with borderline findings, low risk, bilateral: Secondary | ICD-10-CM | POA: Diagnosis not present

## 2020-05-14 DIAGNOSIS — H25043 Posterior subcapsular polar age-related cataract, bilateral: Secondary | ICD-10-CM | POA: Diagnosis not present

## 2020-05-29 DIAGNOSIS — K219 Gastro-esophageal reflux disease without esophagitis: Secondary | ICD-10-CM | POA: Insufficient documentation

## 2020-05-29 DIAGNOSIS — E785 Hyperlipidemia, unspecified: Secondary | ICD-10-CM | POA: Insufficient documentation

## 2020-05-29 DIAGNOSIS — C801 Malignant (primary) neoplasm, unspecified: Secondary | ICD-10-CM | POA: Insufficient documentation

## 2020-05-29 DIAGNOSIS — I10 Essential (primary) hypertension: Secondary | ICD-10-CM | POA: Insufficient documentation

## 2020-05-30 ENCOUNTER — Other Ambulatory Visit: Payer: Self-pay | Admitting: Cardiology

## 2020-06-01 ENCOUNTER — Ambulatory Visit: Payer: Medicare Other | Admitting: Cardiology

## 2020-06-01 ENCOUNTER — Encounter: Payer: Self-pay | Admitting: Cardiology

## 2020-06-01 ENCOUNTER — Other Ambulatory Visit: Payer: Self-pay

## 2020-06-01 VITALS — BP 100/56 | HR 80 | Ht 72.0 in | Wt 188.0 lb

## 2020-06-01 DIAGNOSIS — Z95818 Presence of other cardiac implants and grafts: Secondary | ICD-10-CM

## 2020-06-01 DIAGNOSIS — I10 Essential (primary) hypertension: Secondary | ICD-10-CM

## 2020-06-01 DIAGNOSIS — Z9861 Coronary angioplasty status: Secondary | ICD-10-CM | POA: Diagnosis not present

## 2020-06-01 DIAGNOSIS — I472 Ventricular tachycardia, unspecified: Secondary | ICD-10-CM

## 2020-06-01 DIAGNOSIS — Z8679 Personal history of other diseases of the circulatory system: Secondary | ICD-10-CM | POA: Diagnosis not present

## 2020-06-01 DIAGNOSIS — Z9889 Other specified postprocedural states: Secondary | ICD-10-CM

## 2020-06-01 DIAGNOSIS — I2510 Atherosclerotic heart disease of native coronary artery without angina pectoris: Secondary | ICD-10-CM

## 2020-06-01 NOTE — Progress Notes (Signed)
Cardiology Office Note:    Date:  06/01/2020   ID:  Scott Whitehead, DOB 29-Mar-1940, MRN [id number]  PCP:  Street, Sharon Mt, MD  Cardiologist:  Jenne Campus, MD    Referring MD: Street, Sharon Mt, *   Chief Complaint  Patient presents with  . Follow-up  I am doing great  History of Present Illness:    Scott Whitehead is a 80 y.o. male with past medical history significant for coronary artery disease, years ago he did have stents to LAD, s/p atrial flutter ablation, implantable loop recorder in place however battery depleted, he does not want to have it removed.  Also history of nonsustained ventricular tachycardia, dyslipidemia, essential hypertension.  Comes today to my office for follow-up overall doing great.  Asymptomatic still busy working regarding have no difficulty doing it.  No chest pain tightness squeezing pressure burning chest no dizziness no palpitations.  Overall feeling well.  He actually tells me that he is better than compared to a year ago.  Past Medical History:  Diagnosis Date  . Angina pectoris ([place]) 09/09/2016  . CAD S/P percutaneous coronary angioplasty    Stent to LAD many years ago  . Cancer ([place])   . Coronary artery disease involving native coronary artery 04/02/2015   Cardiac catheterization in the middle of 2018 showing nonobstructive lesion PET patent stent site  . Dizziness 11/09/2016  . Dyspnea on exertion 12/24/2018  . Essential hypertension 04/28/2016  . GERD (gastroesophageal reflux disease)   . High risk medication use 06/05/2015  . Hyperlipidemia   . Hyperlipidemia with target low density lipoprotein (LDL) cholesterol less than 70 mg/dL 09/13/2016  . Hypertension   . Primary central sleep apnea 04/02/2015  . Status post ablation of atrial flutter 04/02/2015  . Status post placement of implantable loop recorder 06/02/2017   Battery already depleted patient does not want to have it removed  . Syncope 04/02/2015  . VT (ventricular  tachycardia) ([place]) 04/02/2015    Past Surgical History:  Procedure Laterality Date  . CARDIAC SURGERY     heart monitor is placed in the heart area  . ENDOVENOUS ABLATION SAPHENOUS VEIN W/ LASER Right 03/08/2018   endovenous laser ablation right greater saphenous vein by Deitra Mayo MD   . LEFT HEART CATH AND CORONARY ANGIOGRAPHY N/A 09/13/2016   Procedure: Left Heart Cath and Coronary Angiography;  Surgeon: Leonie Man, MD;  Location: [place] [place] [facility];  Service: Cardiovascular;  Laterality: N/A;  . LOOP RECORDER IMPLANT    . stab phlebectomy  Right 08/23/2018   stab phlebectomy 10-20 incisions right leg by Chritopher Scot Dock MD     Current Medications: Current Meds  Medication Sig  . aspirin EC 81 MG tablet Take 81 mg by mouth daily.  . benzonatate (TESSALON) 200 MG capsule Take 200 mg by mouth 3 (three) times daily as needed for cough.  . BESIVANCE 0.6 % SUSP Apply 1 application to eye as directed. Pt is not aware of direction at this time  . diphenhydrAMINE (BENADRYL) 25 MG tablet Take 25 mg by mouth at bedtime as needed for sleep.  . DUREZOL 0.05 % EMUL Place 1 drop into the left eye 3 (three) times daily.  Marland Kitchen ezetimibe (ZETIA) 10 MG tablet TAKE 1 TABLET BY MOUTH  DAILY (Patient taking differently: Take 10 mg by mouth daily.)  . finasteride (PROSCAR) 5 MG tablet Take 1 tablet by mouth daily.  Marland Kitchen lisinopril (ZESTRIL) 2.5 MG tablet Take 2.5 mg by mouth daily.  Marland Kitchen  MAGNESIUM PO Take 1 tablet by mouth daily. Unknown strength  . Melatonin 10 MG TABS Take 1 tablet by mouth as needed (sleep).  . metoprolol tartrate (LOPRESSOR) 25 MG tablet TAKE 1 TABLET BY MOUTH  TWICE DAILY (Patient taking differently: Take 25 mg by mouth 2 (two) times daily.)  . Multiple Vitamin (MULTIVITAMIN WITH MINERALS) TABS tablet Take 1 tablet by mouth daily. Unknown strength  . MYRBETRIQ 50 MG TB24 tablet Take 50 mg by mouth daily.  . nitroGLYCERIN (NITROSTAT) 0.4 MG SL tablet Place 1 tablet (0.4 mg  total) under the tongue every 5 (five) minutes as needed for chest pain.  . Omega-3 Fatty Acids (FISH OIL) 1000 MG CAPS Take 1,000 mg by mouth 2 (two) times daily.   Marland Kitchen PROLENSA 0.07 % SOLN Apply 1 drop to eye daily.  . ranolazine (RANEXA) 500 MG 12 hr tablet TAKE 1 TABLET BY MOUTH  TWICE DAILY (Patient taking differently: Take 500 mg by mouth 2 (two) times daily.)  . rosuvastatin (CRESTOR) 40 MG tablet Take 1 tablet (40 mg total) by mouth daily.  . tamsulosin (FLOMAX) 0.4 MG CAPS capsule Take 0.4 mg by mouth daily.  . [DISCONTINUED] cefdinir (OMNICEF) 300 MG capsule Take 300 mg by mouth 2 (two) times daily.     Allergies:   Patient has no known allergies.   Social History   Socioeconomic History  . Marital status: Married    Spouse name: Not on file  . Number of children: Not on file  . Years of education: Not on file  . Highest education level: Not on file  Occupational History  . Not on file  Tobacco Use  . Smoking status: Never Smoker  . Smokeless tobacco: Never Used  Vaping Use  . Vaping Use: Never used  Substance and Sexual Activity  . Alcohol use: No  . Drug use: No  . Sexual activity: Not on file  Other Topics Concern  . Not on file  Social History Narrative  . Not on file   Social Determinants of Health   Financial Resource Strain: Not on file  Food Insecurity: Not on file  Transportation Needs: Not on file  Physical Activity: Not on file  Stress: Not on file  Social Connections: Not on file     Family History: The patient's family history includes Alzheimer's disease in his father; Blindness in his father; Glaucoma in his father; Heart disease in his mother; Hypertension in his father and mother. ROS:   Please see the history of present illness.    All 14 point review of systems negative except as described per history of present illness  EKGs/Labs/Other Studies Reviewed:      Recent Labs: No results found for requested labs within last 8760 hours.   Recent Lipid Panel    Component Value Date/Time   CHOL 162 02/14/2019 0849   TRIG 180 (H) 02/14/2019 0849   HDL 40 02/14/2019 0849   CHOLHDL 4.1 02/14/2019 0849   LDLCALC 91 02/14/2019 0849    Physical Exam:    VS:  BP (!) 100/56 (BP Location: Left Arm, Patient Position: Sitting)   Pulse 80   Ht 6' (1.829 m)   Wt 188 lb (85.3 kg)   SpO2 98%   BMI 25.50 kg/m     Wt Readings from Last 3 Encounters:  06/01/20 188 lb (85.3 kg)  12/04/19 186 lb 6.4 oz (84.6 kg)  05/27/19 189 lb 3.2 oz (85.8 kg)     GEN:  Well  nourished, well developed in no acute distress HEENT: Normal NECK: No JVD; No carotid bruits LYMPHATICS: No lymphadenopathy CARDIAC: RRR, no murmurs, no rubs, no gallops RESPIRATORY:  Clear to auscultation without rales, wheezing or rhonchi  ABDOMEN: Soft, non-tender, non-distended MUSCULOSKELETAL:  No edema; No deformity  SKIN: Warm and dry LOWER EXTREMITIES: no swelling NEUROLOGIC:  Alert and oriented x 3 PSYCHIATRIC:  Normal affect   ASSESSMENT:    1. Coronary artery disease involving native coronary artery of native heart without angina pectoris   2. CAD S/P percutaneous coronary angioplasty   3. Essential hypertension   4. VT (ventricular tachycardia) ([facility])   5. Status post ablation of atrial flutter   6. Status post placement of implantable loop recorder    PLAN:    In order of problems listed above:  1. Coronary artery disease stable from that point review on appropriate medication which include antiplatelet therapy as well as statin. 2. Essential hypertension, blood pressure well controlled we will continue present medications.  He checks his blood pressure at home usually 120/70. 3. History of ventricular tachycardia, asymptomatic no palpitations no dizziness no passing out so far work-up negative. 4. Status post atrial flutter ablation.  Completely asymptomatic no more recurrences of arrhythmia. 5. Status post implantable loop recorder implantation.   Battery already depleted.  Does not want to have device removed.  We will continue monitoring. 6. Dyslipidemia he is on statin I did review his lab work test done by primary care physician LDL 45 HDL 40 this is May 08, 2020 he is on high intense statin form of Crestor 40 which I will continue.   Medication Adjustments/Labs and Tests Ordered: Current medicines are reviewed at length with the patient today.  Concerns regarding medicines are outlined above.  No orders of the defined types were placed in this encounter.  Medication changes: No orders of the defined types were placed in this encounter.   Signed, Park Liter, MD, [facility] 06/01/2020 8:47 AM    [place]

## 2020-06-01 NOTE — Patient Instructions (Signed)

## 2020-06-01 NOTE — Telephone Encounter (Signed)
Zetia 10 mg tablet # 90 x 3 additional refills to pharmacy

## 2020-06-24 DIAGNOSIS — L578 Other skin changes due to chronic exposure to nonionizing radiation: Secondary | ICD-10-CM | POA: Diagnosis not present

## 2020-06-24 DIAGNOSIS — D225 Melanocytic nevi of trunk: Secondary | ICD-10-CM | POA: Diagnosis not present

## 2020-06-24 DIAGNOSIS — L814 Other melanin hyperpigmentation: Secondary | ICD-10-CM | POA: Diagnosis not present

## 2020-06-24 DIAGNOSIS — L570 Actinic keratosis: Secondary | ICD-10-CM | POA: Diagnosis not present

## 2020-07-14 DIAGNOSIS — H2510 Age-related nuclear cataract, unspecified eye: Secondary | ICD-10-CM

## 2020-07-14 HISTORY — DX: Age-related nuclear cataract, unspecified eye: H25.10

## 2020-07-15 DIAGNOSIS — I2510 Atherosclerotic heart disease of native coronary artery without angina pectoris: Secondary | ICD-10-CM | POA: Diagnosis not present

## 2020-07-15 DIAGNOSIS — Z85820 Personal history of malignant melanoma of skin: Secondary | ICD-10-CM | POA: Diagnosis not present

## 2020-07-15 DIAGNOSIS — E785 Hyperlipidemia, unspecified: Secondary | ICD-10-CM | POA: Diagnosis not present

## 2020-07-15 DIAGNOSIS — H25013 Cortical age-related cataract, bilateral: Secondary | ICD-10-CM | POA: Diagnosis not present

## 2020-07-15 DIAGNOSIS — Z7982 Long term (current) use of aspirin: Secondary | ICD-10-CM | POA: Diagnosis not present

## 2020-07-15 DIAGNOSIS — H2512 Age-related nuclear cataract, left eye: Secondary | ICD-10-CM | POA: Diagnosis not present

## 2020-07-15 DIAGNOSIS — Z79899 Other long term (current) drug therapy: Secondary | ICD-10-CM | POA: Diagnosis not present

## 2020-07-15 DIAGNOSIS — I10 Essential (primary) hypertension: Secondary | ICD-10-CM | POA: Diagnosis not present

## 2020-07-16 DIAGNOSIS — H2511 Age-related nuclear cataract, right eye: Secondary | ICD-10-CM | POA: Diagnosis not present

## 2020-07-29 DIAGNOSIS — Z79899 Other long term (current) drug therapy: Secondary | ICD-10-CM | POA: Diagnosis not present

## 2020-07-29 DIAGNOSIS — E7800 Pure hypercholesterolemia, unspecified: Secondary | ICD-10-CM | POA: Diagnosis not present

## 2020-07-29 DIAGNOSIS — Z85820 Personal history of malignant melanoma of skin: Secondary | ICD-10-CM | POA: Diagnosis not present

## 2020-07-29 DIAGNOSIS — H40013 Open angle with borderline findings, low risk, bilateral: Secondary | ICD-10-CM | POA: Diagnosis not present

## 2020-07-29 DIAGNOSIS — Z955 Presence of coronary angioplasty implant and graft: Secondary | ICD-10-CM | POA: Diagnosis not present

## 2020-07-29 DIAGNOSIS — H2511 Age-related nuclear cataract, right eye: Secondary | ICD-10-CM | POA: Diagnosis not present

## 2020-07-29 DIAGNOSIS — H25013 Cortical age-related cataract, bilateral: Secondary | ICD-10-CM | POA: Diagnosis not present

## 2020-07-29 DIAGNOSIS — E785 Hyperlipidemia, unspecified: Secondary | ICD-10-CM | POA: Diagnosis not present

## 2020-07-29 DIAGNOSIS — I2510 Atherosclerotic heart disease of native coronary artery without angina pectoris: Secondary | ICD-10-CM | POA: Diagnosis not present

## 2020-07-29 DIAGNOSIS — D759 Disease of blood and blood-forming organs, unspecified: Secondary | ICD-10-CM | POA: Diagnosis not present

## 2020-07-29 DIAGNOSIS — Z961 Presence of intraocular lens: Secondary | ICD-10-CM | POA: Diagnosis not present

## 2020-07-29 DIAGNOSIS — H52223 Regular astigmatism, bilateral: Secondary | ICD-10-CM | POA: Diagnosis not present

## 2020-07-29 DIAGNOSIS — Z7982 Long term (current) use of aspirin: Secondary | ICD-10-CM | POA: Diagnosis not present

## 2020-07-29 DIAGNOSIS — I10 Essential (primary) hypertension: Secondary | ICD-10-CM | POA: Diagnosis not present

## 2020-07-29 DIAGNOSIS — H2513 Age-related nuclear cataract, bilateral: Secondary | ICD-10-CM | POA: Diagnosis not present

## 2020-08-02 ENCOUNTER — Other Ambulatory Visit: Payer: Self-pay | Admitting: Cardiology

## 2020-08-05 ENCOUNTER — Other Ambulatory Visit: Payer: Self-pay | Admitting: Cardiology

## 2020-08-13 DIAGNOSIS — I10 Essential (primary) hypertension: Secondary | ICD-10-CM | POA: Diagnosis not present

## 2020-08-13 DIAGNOSIS — E782 Mixed hyperlipidemia: Secondary | ICD-10-CM | POA: Diagnosis not present

## 2020-11-02 DIAGNOSIS — K219 Gastro-esophageal reflux disease without esophagitis: Secondary | ICD-10-CM | POA: Diagnosis not present

## 2020-11-20 DIAGNOSIS — Z1211 Encounter for screening for malignant neoplasm of colon: Secondary | ICD-10-CM | POA: Diagnosis not present

## 2020-11-20 DIAGNOSIS — Z09 Encounter for follow-up examination after completed treatment for conditions other than malignant neoplasm: Secondary | ICD-10-CM | POA: Diagnosis not present

## 2020-11-20 DIAGNOSIS — Z86010 Personal history of colonic polyps: Secondary | ICD-10-CM | POA: Diagnosis not present

## 2020-11-20 DIAGNOSIS — Z85828 Personal history of other malignant neoplasm of skin: Secondary | ICD-10-CM | POA: Diagnosis not present

## 2020-12-08 DIAGNOSIS — J22 Unspecified acute lower respiratory infection: Secondary | ICD-10-CM | POA: Diagnosis not present

## 2020-12-15 DIAGNOSIS — Z23 Encounter for immunization: Secondary | ICD-10-CM | POA: Diagnosis not present

## 2020-12-28 DIAGNOSIS — L570 Actinic keratosis: Secondary | ICD-10-CM | POA: Diagnosis not present

## 2020-12-28 DIAGNOSIS — L578 Other skin changes due to chronic exposure to nonionizing radiation: Secondary | ICD-10-CM | POA: Diagnosis not present

## 2020-12-28 DIAGNOSIS — L821 Other seborrheic keratosis: Secondary | ICD-10-CM | POA: Diagnosis not present

## 2020-12-28 DIAGNOSIS — C44319 Basal cell carcinoma of skin of other parts of face: Secondary | ICD-10-CM | POA: Diagnosis not present

## 2020-12-30 ENCOUNTER — Ambulatory Visit: Payer: Medicare Other | Admitting: Cardiology

## 2020-12-30 ENCOUNTER — Encounter: Payer: Self-pay | Admitting: Cardiology

## 2020-12-30 ENCOUNTER — Other Ambulatory Visit: Payer: Self-pay

## 2020-12-30 VITALS — BP 120/68 | HR 94 | Ht 72.0 in | Wt 190.8 lb

## 2020-12-30 DIAGNOSIS — I2510 Atherosclerotic heart disease of native coronary artery without angina pectoris: Secondary | ICD-10-CM

## 2020-12-30 DIAGNOSIS — E785 Hyperlipidemia, unspecified: Secondary | ICD-10-CM

## 2020-12-30 DIAGNOSIS — Z9861 Coronary angioplasty status: Secondary | ICD-10-CM

## 2020-12-30 DIAGNOSIS — Z95818 Presence of other cardiac implants and grafts: Secondary | ICD-10-CM

## 2020-12-30 DIAGNOSIS — I10 Essential (primary) hypertension: Secondary | ICD-10-CM | POA: Diagnosis not present

## 2020-12-30 DIAGNOSIS — I472 Ventricular tachycardia, unspecified: Secondary | ICD-10-CM

## 2020-12-30 NOTE — Addendum Note (Signed)
Addended by: Senaida Ores on: 12/30/2020 04:59 PM   Modules accepted: Orders

## 2020-12-30 NOTE — Progress Notes (Signed)
Cardiology Office Note:    Date:  12/30/2020   ID:  Scott Whitehead, DOB 12-06-1940, MRN [id number]  PCP:  Street, Sharon Mt, MD  Cardiologist:  Jenne Campus, MD    Referring MD: Street, Sharon Mt, *   Chief Complaint  Patient presents with   Follow-up  Doing well  History of Present Illness:    Scott Whitehead is a 80 y.o. male  male with past medical history significant for coronary artery disease, years ago he did have stents to LAD, s/p atrial flutter ablation, implantable loop recorder in place however battery depleted, he does not want to have it removed.  Also history of nonsustained ventricular tachycardia, dyslipidemia, essential hypertension. He comes today to my office for follow-up overall he is doing very well.  He denies any chest pain tightness squeezing pressure burning chest.  He is still very busy hauling wood for winter to warm up his house.  The only complaint he has which being ongoing complaint for many years is the fact when he is trying to bend forward and take something from the floor he will get short of breath.  Otherwise he can perform activities of daily living and physical labor with no problem  Past Medical History:  Diagnosis Date   Angina pectoris ([place]) 09/09/2016   CAD S/P percutaneous coronary angioplasty    Stent to LAD many years ago   Cancer [facility])    Coronary artery disease involving native coronary artery 04/02/2015   Cardiac catheterization in the middle of 2018 showing nonobstructive lesion PET patent stent site   Dizziness 11/09/2016   Dyspnea on exertion 12/24/2018   Essential hypertension 04/28/2016   GERD (gastroesophageal reflux disease)    High risk medication use 06/05/2015   Hyperlipidemia    Hyperlipidemia with target low density lipoprotein (LDL) cholesterol less than 70 mg/dL 09/13/2016   Hypertension    Primary central sleep apnea 04/02/2015   Status post ablation of atrial flutter 04/02/2015   Status post placement of  implantable loop recorder 06/02/2017   Battery already depleted patient does not want to have it removed   Syncope 04/02/2015   VT (ventricular tachycardia) 04/02/2015    Past Surgical History:  Procedure Laterality Date   CARDIAC SURGERY     heart monitor is placed in the heart area   ENDOVENOUS ABLATION SAPHENOUS VEIN W/ LASER Right 03/08/2018   endovenous laser ablation right greater saphenous vein by Deitra Mayo MD    LEFT HEART CATH AND CORONARY ANGIOGRAPHY N/A 09/13/2016   Procedure: Left Heart Cath and Coronary Angiography;  Surgeon: Leonie Man, MD;  Location: [place] [facility];  Service: Cardiovascular;  Laterality: N/A;   LOOP RECORDER IMPLANT     stab phlebectomy  Right 08/23/2018   stab phlebectomy 10-20 incisions right leg by Chritopher Scot Dock MD     Current Medications: Current Meds  Medication Sig   aspirin EC 81 MG tablet Take 81 mg by mouth daily.   benzonatate (TESSALON) 200 MG capsule Take 200 mg by mouth 3 (three) times daily as needed for cough.   BESIVANCE 0.6 % SUSP Apply 1 application to eye as directed. Pt is not aware of direction at this time   diphenhydrAMINE (BENADRYL) 25 MG tablet Take 25 mg by mouth at bedtime as needed for sleep.   DUREZOL 0.05 % EMUL Place 1 drop into the left eye 3 (three) times daily.   ezetimibe (ZETIA) 10 MG tablet TAKE 1 TABLET BY MOUTH  DAILY (Patient  taking differently: Take 10 mg by mouth daily.)   finasteride (PROSCAR) 5 MG tablet Take 1 tablet by mouth daily.   lisinopril (ZESTRIL) 2.5 MG tablet Take 2.5 mg by mouth daily.   MAGNESIUM PO Take 1 tablet by mouth daily. Unknown strength   Melatonin 10 MG TABS Take 1 tablet by mouth as needed (sleep).   metoprolol tartrate (LOPRESSOR) 25 MG tablet TAKE 1 TABLET BY MOUTH  TWICE DAILY (Patient taking differently: Take 25 mg by mouth 2 (two) times daily.)   Multiple Vitamin (MULTIVITAMIN WITH MINERALS) TABS tablet Take 1 tablet by mouth daily. Unknown strength    MYRBETRIQ 50 MG TB24 tablet Take 50 mg by mouth daily.   nitroGLYCERIN (NITROSTAT) 0.4 MG SL tablet Place 1 tablet (0.4 mg total) under the tongue every 5 (five) minutes as needed for chest pain.   Omega-3 Fatty Acids (FISH OIL) 1000 MG CAPS Take 1,000 mg by mouth 2 (two) times daily.    PROLENSA 0.07 % SOLN Apply 1 drop to eye daily.   ranolazine (RANEXA) 500 MG 12 hr tablet TAKE 1 TABLET BY MOUTH  TWICE DAILY (Patient taking differently: Take 500 mg by mouth 2 (two) times daily.)   rosuvastatin (CRESTOR) 40 MG tablet TAKE 1 TABLET BY MOUTH  DAILY (Patient taking differently: Take 40 mg by mouth daily.)   tamsulosin (FLOMAX) 0.4 MG CAPS capsule Take 0.4 mg by mouth daily.     Allergies:   Patient has no known allergies.   Social History   Socioeconomic History   Marital status: Married    Spouse name: Not on file   Number of children: Not on file   Years of education: Not on file   Highest education level: Not on file  Occupational History   Not on file  Tobacco Use   Smoking status: Never   Smokeless tobacco: Never  Vaping Use   Vaping Use: Never used  Substance and Sexual Activity   Alcohol use: No   Drug use: No   Sexual activity: Not on file  Other Topics Concern   Not on file  Social History Narrative   Not on file   Social Determinants of Health   Financial Resource Strain: Not on file  Food Insecurity: Not on file  Transportation Needs: Not on file  Physical Activity: Not on file  Stress: Not on file  Social Connections: Not on file     Family History: The patient's family history includes Alzheimer's disease in his father; Blindness in his father; Glaucoma in his father; Heart disease in his mother; Hypertension in his father and mother. ROS:   Please see the history of present illness.    All 14 point review of systems negative except as described per history of present illness  EKGs/Labs/Other Studies Reviewed:      Recent Labs: No results found for  requested labs within last 8760 hours.  Recent Lipid Panel    Component Value Date/Time   CHOL 162 02/14/2019 0849   TRIG 180 (H) 02/14/2019 0849   HDL 40 02/14/2019 0849   CHOLHDL 4.1 02/14/2019 0849   LDLCALC 91 02/14/2019 0849    Physical Exam:    VS:  BP 120/68 (BP Location: Right Arm, Patient Position: Sitting)   Pulse 94   Ht 6' (1.829 m)   Wt 190 lb 12.8 oz (86.5 kg)   SpO2 97%   BMI 25.88 kg/m     Wt Readings from Last 3 Encounters:  12/30/20 190 lb  12.8 oz (86.5 kg)  06/01/20 188 lb (85.3 kg)  12/04/19 186 lb 6.4 oz (84.6 kg)     GEN:  Well nourished, well developed in no acute distress HEENT: Normal NECK: No JVD; No carotid bruits LYMPHATICS: No lymphadenopathy CARDIAC: RRR, no murmurs, no rubs, no gallops RESPIRATORY:  Clear to auscultation without rales, wheezing or rhonchi  ABDOMEN: Soft, non-tender, non-distended MUSCULOSKELETAL:  No edema; No deformity  SKIN: Warm and dry LOWER EXTREMITIES: no swelling NEUROLOGIC:  Alert and oriented x 3 PSYCHIATRIC:  Normal affect   ASSESSMENT:    1. Coronary artery disease involving native coronary artery of native heart without angina pectoris   2. Essential hypertension   3. VT (ventricular tachycardia)   4. CAD S/P percutaneous coronary angioplasty   5. Hyperlipidemia with target low density lipoprotein (LDL) cholesterol less than 70 mg/dL   6. Status post placement of implantable loop recorder    PLAN:    In order of problems listed above:  Coronary disease, stable not in any symptoms.  We will continue present medications which include antiplatelets therapy in form of aspirin. Essential hypertension blood pressure well controlled continue present management. Ventricular tachycardia denies have any dizziness or passing out.  He is on beta-blocker which I will continue Hyperlipidemia did review his K PN which show me his LDL of 91 HDL 40 this is from March we will schedule him to have fasting lipid profile  done.   Medication Adjustments/Labs and Tests Ordered: Current medicines are reviewed at length with the patient today.  Concerns regarding medicines are outlined above.  No orders of the defined types were placed in this encounter.  Medication changes: No orders of the defined types were placed in this encounter.   Signed, Park Liter, MD, [facility] 12/30/2020 4:55 PM    [place]

## 2020-12-30 NOTE — Patient Instructions (Signed)
Medication Instructions:  Your physician recommends that you continue on your current medications as directed. Please refer to the Current Medication list given to you today.  *If you need a refill on your cardiac medications before your next appointment, please call your pharmacy*   Lab Work: Your physician recommends that you return for lab work when fasting: lipid  If you have labs (blood work) drawn today and your tests are completely normal, you will receive your results only by: [place] (if you have MyChart) OR A paper copy in the mail If you have any lab test that is abnormal or we need to change your treatment, we will call you to review the results.   Testing/Procedures: None   Follow-Up: At [facility], you and your health needs are our priority.  As part of our continuing mission to provide you with exceptional heart care, we have created designated Provider Care Teams.  These Care Teams include your primary Cardiologist (physician) and Advanced Practice Providers (APPs -  Physician Assistants and Nurse Practitioners) who all work together to provide you with the care you need, when you need it.  We recommend signing up for the patient portal called "MyChart".  Sign up information is provided on this After Visit Summary.  MyChart is used to connect with patients for Virtual Visits (Telemedicine).  Patients are able to view lab/test results, encounter notes, upcoming appointments, etc.  Non-urgent messages can be sent to your provider as well.   To learn more about what you can do with MyChart, go to [email or web address].    Your next appointment:   6 month(s)  The format for your next appointment:   In Person  Provider:   Jenne Campus, MD    Other Instructions

## 2020-12-31 DIAGNOSIS — I2510 Atherosclerotic heart disease of native coronary artery without angina pectoris: Secondary | ICD-10-CM | POA: Diagnosis not present

## 2020-12-31 DIAGNOSIS — E785 Hyperlipidemia, unspecified: Secondary | ICD-10-CM | POA: Diagnosis not present

## 2020-12-31 DIAGNOSIS — I10 Essential (primary) hypertension: Secondary | ICD-10-CM | POA: Diagnosis not present

## 2021-01-01 LAB — LIPID PANEL
Chol/HDL Ratio: 2.3 ratio (ref 0.0–5.0)
Cholesterol, Total: 104 mg/dL (ref 100–199)
HDL: 46 mg/dL (ref >39)
LDL Chol Calc (NIH): 41 mg/dL (ref 0–99)
Triglycerides: 83 mg/dL (ref 0–149)
VLDL Cholesterol Cal: 17 mg/dL (ref 5–40)

## 2021-01-27 ENCOUNTER — Other Ambulatory Visit: Payer: Self-pay | Admitting: Cardiology

## 2021-05-01 ENCOUNTER — Other Ambulatory Visit: Payer: Self-pay | Admitting: Cardiology

## 2021-05-10 DIAGNOSIS — K2960 Other gastritis without bleeding: Secondary | ICD-10-CM | POA: Diagnosis not present

## 2021-05-10 DIAGNOSIS — I4729 Other ventricular tachycardia: Secondary | ICD-10-CM | POA: Diagnosis not present

## 2021-05-10 DIAGNOSIS — L821 Other seborrheic keratosis: Secondary | ICD-10-CM | POA: Diagnosis not present

## 2021-05-10 DIAGNOSIS — N3281 Overactive bladder: Secondary | ICD-10-CM | POA: Diagnosis not present

## 2021-05-10 DIAGNOSIS — Z79899 Other long term (current) drug therapy: Secondary | ICD-10-CM | POA: Diagnosis not present

## 2021-05-10 DIAGNOSIS — R7302 Impaired glucose tolerance (oral): Secondary | ICD-10-CM | POA: Diagnosis not present

## 2021-05-10 DIAGNOSIS — I8390 Asymptomatic varicose veins of unspecified lower extremity: Secondary | ICD-10-CM | POA: Diagnosis not present

## 2021-05-10 DIAGNOSIS — I10 Essential (primary) hypertension: Secondary | ICD-10-CM | POA: Diagnosis not present

## 2021-05-10 DIAGNOSIS — D692 Other nonthrombocytopenic purpura: Secondary | ICD-10-CM | POA: Diagnosis not present

## 2021-05-10 DIAGNOSIS — Z0000 Encounter for general adult medical examination without abnormal findings: Secondary | ICD-10-CM | POA: Diagnosis not present

## 2021-05-10 DIAGNOSIS — I25119 Atherosclerotic heart disease of native coronary artery with unspecified angina pectoris: Secondary | ICD-10-CM | POA: Diagnosis not present

## 2021-05-10 DIAGNOSIS — E782 Mixed hyperlipidemia: Secondary | ICD-10-CM | POA: Diagnosis not present

## 2021-06-21 DIAGNOSIS — L578 Other skin changes due to chronic exposure to nonionizing radiation: Secondary | ICD-10-CM | POA: Diagnosis not present

## 2021-06-21 DIAGNOSIS — L814 Other melanin hyperpigmentation: Secondary | ICD-10-CM | POA: Diagnosis not present

## 2021-06-21 DIAGNOSIS — L570 Actinic keratosis: Secondary | ICD-10-CM | POA: Diagnosis not present

## 2021-06-21 DIAGNOSIS — D225 Melanocytic nevi of trunk: Secondary | ICD-10-CM | POA: Diagnosis not present

## 2021-07-05 ENCOUNTER — Ambulatory Visit: Payer: Medicare Other | Admitting: Cardiology

## 2021-07-05 ENCOUNTER — Encounter: Payer: Self-pay | Admitting: Cardiology

## 2021-07-05 VITALS — BP 138/62 | HR 68 | Ht 72.0 in | Wt 188.0 lb

## 2021-07-05 DIAGNOSIS — E782 Mixed hyperlipidemia: Secondary | ICD-10-CM

## 2021-07-05 DIAGNOSIS — I2510 Atherosclerotic heart disease of native coronary artery without angina pectoris: Secondary | ICD-10-CM | POA: Diagnosis not present

## 2021-07-05 DIAGNOSIS — I472 Ventricular tachycardia, unspecified: Secondary | ICD-10-CM

## 2021-07-05 DIAGNOSIS — I209 Angina pectoris, unspecified: Secondary | ICD-10-CM

## 2021-07-05 DIAGNOSIS — I10 Essential (primary) hypertension: Secondary | ICD-10-CM

## 2021-07-05 NOTE — Patient Instructions (Signed)

## 2021-07-05 NOTE — Progress Notes (Signed)
Cardiology Office Note:    Date:  07/05/2021   ID:  Scott Whitehead, DOB 10-23-1940, MRN [id number]  PCP:  Street, Sharon Mt, MD  Cardiologist:  Jenne Campus, MD    Referring MD: Street, Sharon Mt, *   Chief Complaint  Patient presents with   Follow-up  Doing very well  History of Present Illness:    Scott Whitehead is a 81 y.o. male with past medical history significant for coronary artery disease, PTCA and stenting of the LAD done many years ago, status post atrial flutter ablation, implantable loop recorder in place of a battery depleted patient does not want to remove it, dyslipidemia.  Last evaluation of coronary artery was done by cardiac catheterization 2019 showing nonobstructive lesions with patent stent site. Comes today to my office for follow-up.  Overall doing very well.  He denies have any chest pain tightness squeezing pressure burning chest no palpitations dizziness.  He is busy working in the garden and enjoying it.  Past Medical History:  Diagnosis Date   Angina pectoris ([place]) 09/09/2016   CAD S/P percutaneous coronary angioplasty    Stent to LAD many years ago   Cancer [facility])    Coronary artery disease involving native coronary artery 04/02/2015   Cardiac catheterization in the middle of 2018 showing nonobstructive lesion PET patent stent site   Dizziness 11/09/2016   Dyspnea on exertion 12/24/2018   Essential hypertension 04/28/2016   GERD (gastroesophageal reflux disease)    High risk medication use 06/05/2015   Hyperlipidemia    Hyperlipidemia with target low density lipoprotein (LDL) cholesterol less than 70 mg/dL 09/13/2016   Hypertension    Primary central sleep apnea 04/02/2015   Status post ablation of atrial flutter 04/02/2015   Status post placement of implantable loop recorder 06/02/2017   Battery already depleted patient does not want to have it removed   Syncope 04/02/2015   VT (ventricular tachycardia) ([place]) 04/02/2015    Past Surgical  History:  Procedure Laterality Date   CARDIAC SURGERY     heart monitor is placed in the heart area   ENDOVENOUS ABLATION SAPHENOUS VEIN W/ LASER Right 03/08/2018   endovenous laser ablation right greater saphenous vein by Deitra Mayo MD    LEFT HEART CATH AND CORONARY ANGIOGRAPHY N/A 09/13/2016   Procedure: Left Heart Cath and Coronary Angiography;  Surgeon: Leonie Man, MD;  Location: [place] [facility];  Service: Cardiovascular;  Laterality: N/A;   LOOP RECORDER IMPLANT     stab phlebectomy  Right 08/23/2018   stab phlebectomy 10-20 incisions right leg by Chritopher Scot Dock MD     Current Medications: Current Meds  Medication Sig   diphenhydrAMINE (BENADRYL) 25 MG tablet Take 25 mg by mouth every 6 (six) hours as needed for sleep.     Allergies:   Patient has no known allergies.   Social History   Socioeconomic History   Marital status: Married    Spouse name: Not on file   Number of children: Not on file   Years of education: Not on file   Highest education level: Not on file  Occupational History   Not on file  Tobacco Use   Smoking status: Never   Smokeless tobacco: Never  Vaping Use   Vaping Use: Never used  Substance and Sexual Activity   Alcohol use: No   Drug use: No   Sexual activity: Not on file  Other Topics Concern   Not on file  Social History Narrative  Not on file   Social Determinants of Health   Financial Resource Strain: Not on file  Food Insecurity: Not on file  Transportation Needs: Not on file  Physical Activity: Not on file  Stress: Not on file  Social Connections: Not on file     Family History: The patient's family history includes Alzheimer's disease in his father; Blindness in his father; Glaucoma in his father; Heart disease in his mother; Hypertension in his father and mother. ROS:   Please see the history of present illness.    All 14 point review of systems negative except as described per history of present  illness  EKGs/Labs/Other Studies Reviewed:      Recent Labs: No results found for requested labs within last 8760 hours.  Recent Lipid Panel    Component Value Date/Time   CHOL 104 12/31/2020 1055   TRIG 83 12/31/2020 1055   HDL 46 12/31/2020 1055   CHOLHDL 2.3 12/31/2020 1055   LDLCALC 41 12/31/2020 1055    Physical Exam:    VS:  BP 138/62 (BP Location: Left Arm, Patient Position: Sitting)   Pulse 68   Ht 6' (1.829 m)   Wt 188 lb (85.3 kg)   BMI 25.50 kg/m     Wt Readings from Last 3 Encounters:  07/05/21 188 lb (85.3 kg)  12/30/20 190 lb 12.8 oz (86.5 kg)  06/01/20 188 lb (85.3 kg)     GEN:  Well nourished, well developed in no acute distress HEENT: Normal NECK: No JVD; No carotid bruits LYMPHATICS: No lymphadenopathy CARDIAC: RRR, no murmurs, no rubs, no gallops RESPIRATORY:  Clear to auscultation without rales, wheezing or rhonchi  ABDOMEN: Soft, non-tender, non-distended MUSCULOSKELETAL:  No edema; No deformity  SKIN: Warm and dry LOWER EXTREMITIES: no swelling NEUROLOGIC:  Alert and oriented x 3 PSYCHIATRIC:  Normal affect   ASSESSMENT:    1. Coronary artery disease involving native coronary artery of native heart without angina pectoris   2. Essential hypertension   3. VT (ventricular tachycardia) ([place])   4. Angina pectoris ([place])   5. Mixed hyperlipidemia    PLAN:    In order of problems listed above:  Coronary disease stable from that point review and appropriate medications which I will continue. Essential hypertension blood pressure slightly on the higher side but he said will check it at home always good.  We will continue present management. Dyslipidemia I did review K PN which show me LDL of 41 HDL 44 this is from March of this year we will continue present management.  Good cholesterol profile History of ventricular tachycardia.  PVCs noted on the EKG he is completely asymptomatic without dizziness or passing out palpitations.  We will  continue monitoring   Medication Adjustments/Labs and Tests Ordered: Current medicines are reviewed at length with the patient today.  Concerns regarding medicines are outlined above.  No orders of the defined types were placed in this encounter.  Medication changes: No orders of the defined types were placed in this encounter.   Signed, Park Liter, MD, [facility] 07/05/2021 [place]

## 2021-07-08 ENCOUNTER — Other Ambulatory Visit: Payer: Self-pay | Admitting: Cardiology

## 2021-09-25 ENCOUNTER — Other Ambulatory Visit: Payer: Self-pay | Admitting: Cardiology

## 2021-09-27 NOTE — Telephone Encounter (Signed)
Rosuvastatin 40 mg # 90 x 3 [place] ([facility]) - [place], [place] to

## 2021-10-18 ENCOUNTER — Other Ambulatory Visit: Payer: Self-pay | Admitting: Cardiology

## 2021-10-19 NOTE — Telephone Encounter (Signed)
Zetia 10 mg # 90 only with message patient needs appointment for future refills sen to   [place] ([facility]) - [place], [place]

## 2021-10-26 DIAGNOSIS — M25512 Pain in left shoulder: Secondary | ICD-10-CM | POA: Diagnosis not present

## 2021-11-09 DIAGNOSIS — M19012 Primary osteoarthritis, left shoulder: Secondary | ICD-10-CM | POA: Diagnosis not present

## 2021-12-17 DIAGNOSIS — Z23 Encounter for immunization: Secondary | ICD-10-CM | POA: Diagnosis not present

## 2021-12-24 DIAGNOSIS — L578 Other skin changes due to chronic exposure to nonionizing radiation: Secondary | ICD-10-CM | POA: Diagnosis not present

## 2021-12-24 DIAGNOSIS — L570 Actinic keratosis: Secondary | ICD-10-CM | POA: Diagnosis not present

## 2021-12-24 DIAGNOSIS — L814 Other melanin hyperpigmentation: Secondary | ICD-10-CM | POA: Diagnosis not present

## 2021-12-24 DIAGNOSIS — L821 Other seborrheic keratosis: Secondary | ICD-10-CM | POA: Diagnosis not present

## 2021-12-30 ENCOUNTER — Ambulatory Visit: Payer: Medicare Other | Attending: Cardiology | Admitting: Cardiology

## 2021-12-30 ENCOUNTER — Encounter: Payer: Self-pay | Admitting: Cardiology

## 2021-12-30 VITALS — BP 122/60 | HR 73 | Ht 72.0 in | Wt 191.8 lb

## 2021-12-30 DIAGNOSIS — Z9861 Coronary angioplasty status: Secondary | ICD-10-CM

## 2021-12-30 DIAGNOSIS — I472 Ventricular tachycardia, unspecified: Secondary | ICD-10-CM | POA: Diagnosis not present

## 2021-12-30 DIAGNOSIS — E782 Mixed hyperlipidemia: Secondary | ICD-10-CM | POA: Diagnosis not present

## 2021-12-30 DIAGNOSIS — I10 Essential (primary) hypertension: Secondary | ICD-10-CM

## 2021-12-30 DIAGNOSIS — Z8679 Personal history of other diseases of the circulatory system: Secondary | ICD-10-CM | POA: Diagnosis not present

## 2021-12-30 DIAGNOSIS — Z9889 Other specified postprocedural states: Secondary | ICD-10-CM | POA: Diagnosis not present

## 2021-12-30 DIAGNOSIS — I2510 Atherosclerotic heart disease of native coronary artery without angina pectoris: Secondary | ICD-10-CM | POA: Diagnosis not present

## 2021-12-30 NOTE — Patient Instructions (Signed)
Medication Instructions:  Your physician recommends that you continue on your current medications as directed. Please refer to the Current Medication list given to you today.  *If you need a refill on your cardiac medications before your next appointment, please call your pharmacy*   Lab Work: None Ordered If you have labs (blood work) drawn today and your tests are completely normal, you will receive your results only by: [place] (if you have MyChart) OR A paper copy in the mail If you have any lab test that is abnormal or we need to change your treatment, we will call you to review the results.   Testing/Procedures: None Ordered   Follow-Up: At [facility], you and your health needs are our priority.  As part of our continuing mission to provide you with exceptional heart care, we have created designated Provider Care Teams.  These Care Teams include your primary Cardiologist (physician) and Advanced Practice Providers (APPs -  Physician Assistants and Nurse Practitioners) who all work together to provide you with the care you need, when you need it.  We recommend signing up for the patient portal called "MyChart".  Sign up information is provided on this After Visit Summary.  MyChart is used to connect with patients for Virtual Visits (Telemedicine).  Patients are able to view lab/test results, encounter notes, upcoming appointments, etc.  Non-urgent messages can be sent to your provider as well.   To learn more about what you can do with MyChart, go to [email or web address].    Your next appointment:   6 month(s)  The format for your next appointment:   In Person  Provider:   Jenne Campus, MD    Other Instructions    FDA-cleared personal EKG: The world's most clinically validated personal EKG, FDA-cleared to detect Atrial Fibrillation, Bradycardia, and Tachycardia. Evalee Mutton is the most reliable way to check in on your heart from home. Take your EKG from  anywhere: Capture a medical-grade EKG in 30 seconds and get an instant analysis right on your smartphone. Evalee Mutton is small enough to fit in your pocket, so you can take it with you anywhere. Easy to use: Simply place your fingers on the sensors--no wires, patches, or gels. Recommended by doctors: A trusted resource, Evalee Mutton is the #1 doctor-recommended personal EKG with more than 100 million EKGs recorded. Save or share your EKGs: With the press of a button, email your EKGs to your doctor or save them on your phone. Works with smartphones: Compatible with Tour manager and tablets. Check our compatibility chart. FSA/HSA eligible: Purchase using an FSA or HSA account (please confirm coverage with your insurance provider). Phone clip included with purchase, a $15 value. Conveniently take your device with you wherever you go.  https://store.[email or web address].tn   Step One- Record your EKG strip on [facility] app.   Step two- On Kardia EKG click "Download"   Step three- It will prompt you to make a password for this EKG. Please make the password "[email or web address]" so that we can view it.   Step four- Click on the little "upload" button (small box with an arrow in the middle) in the bottom left-hand corner of the screen.   Step five- Click "Save to Files"  Step six- Click on "On my iphone" and then "Pages" then press save in the top right-hand corner.   NOW GO TO MYCHART   Once on MyChart click "Messages"  Step one- Click "Send a message"  Step two- Click "Ask a medical  question"   Step three- Click "Non urgent medical question"   Step four- Click on Rajan Revankar's name.  Step five- Click on the small paperclip at the bottom of the screen  Step six- Click "Choose file"  Step seven- Pick the most recent EKG strip listed.   Once uploaded send the message!

## 2021-12-30 NOTE — Progress Notes (Signed)
Cardiology Office Note:    Date:  12/30/2021   ID:  Scott Whitehead, DOB 1940/06/23, MRN [id number]  PCP:  Street, Sharon Mt, MD  Cardiologist:  Jenne Campus, MD    Referring MD: Street, Sharon Mt, *   Chief Complaint  Patient presents with   elevated HR and BP    Ongoing for weeks    History of Present Illness:    Scott Whitehead is a 81 y.o. male with past medical history significant for coronary artery disease, he did have PTCA and stenting done in LAD many years ago, status post atrial flutter ablation, implantable loop recorder in place however battery depleted patient does not want to have it removed, dyslipidemia.  He came to my office for follow-up overall doing well but he noticed his blood pressure being elevated elevated as well as the fact that he have some elevated heart rate while checking his blood pressure.  Elevated heart rate is between 80 and 90.  He does not feel it.  He is still doing very well works in the garden with no difficulties.  Past Medical History:  Diagnosis Date   Angina pectoris ([place]) 09/09/2016   CAD S/P percutaneous coronary angioplasty    Stent to LAD many years ago   Cancer [facility])    Coronary artery disease involving native coronary artery 04/02/2015   Cardiac catheterization in the middle of 2018 showing nonobstructive lesion PET patent stent site   Dizziness 11/09/2016   Dyspnea on exertion 12/24/2018   Essential hypertension 04/28/2016   GERD (gastroesophageal reflux disease)    High risk medication use 06/05/2015   Hyperlipidemia    Hyperlipidemia with target low density lipoprotein (LDL) cholesterol less than 70 mg/dL 09/13/2016   Hypertension    Primary central sleep apnea 04/02/2015   Status post ablation of atrial flutter 04/02/2015   Status post placement of implantable loop recorder 06/02/2017   Battery already depleted patient does not want to have it removed   Syncope 04/02/2015   VT (ventricular tachycardia) ([place]) 04/02/2015     Past Surgical History:  Procedure Laterality Date   CARDIAC SURGERY     heart monitor is placed in the heart area   ENDOVENOUS ABLATION SAPHENOUS VEIN W/ LASER Right 03/08/2018   endovenous laser ablation right greater saphenous vein by Deitra Mayo MD    LEFT HEART CATH AND CORONARY ANGIOGRAPHY N/A 09/13/2016   Procedure: Left Heart Cath and Coronary Angiography;  Surgeon: Leonie Man, MD;  Location: [place] [facility];  Service: Cardiovascular;  Laterality: N/A;   LOOP RECORDER IMPLANT     stab phlebectomy  Right 08/23/2018   stab phlebectomy 10-20 incisions right leg by Chritopher Scot Dock MD     Current Medications: Current Meds  Medication Sig   aspirin EC 81 MG tablet Take 81 mg by mouth daily.   benzonatate (TESSALON) 200 MG capsule Take 200 mg by mouth 3 (three) times daily as needed for cough.   diphenhydrAMINE (BENADRYL) 25 MG tablet Take 25 mg by mouth every 6 (six) hours as needed for sleep.   ezetimibe (ZETIA) 10 MG tablet Take 1 tablet (10 mg total) by mouth daily. Needs appointment for future refills / 1st attempt   finasteride (PROSCAR) 5 MG tablet Take 1 tablet by mouth daily.   lisinopril (ZESTRIL) 2.5 MG tablet Take 2.5 mg by mouth daily.   MAGNESIUM PO Take 1 tablet by mouth daily. Unknown strength   Melatonin 10 MG TABS Take 1 tablet by mouth  at bedtime as needed (sleep).   metoprolol tartrate (LOPRESSOR) 25 MG tablet Take 1 tablet (25 mg total) by mouth 2 (two) times daily.   Multiple Vitamin (MULTIVITAMIN WITH MINERALS) TABS tablet Take 1 tablet by mouth daily. Unknown strength   MYRBETRIQ 50 MG TB24 tablet Take 50 mg by mouth daily.   nitroGLYCERIN (NITROSTAT) 0.4 MG SL tablet Place 1 tablet (0.4 mg total) under the tongue every 5 (five) minutes as needed for chest pain.   Omega-3 Fatty Acids (FISH OIL) 1000 MG CAPS Take 1,000 mg by mouth 2 (two) times daily.    ranolazine (RANEXA) 500 MG 12 hr tablet TAKE 1 TABLET BY MOUTH  TWICE DAILY (Patient  taking differently: Take 500 mg by mouth 2 (two) times daily.)   rosuvastatin (CRESTOR) 40 MG tablet TAKE 1 TABLET BY MOUTH  DAILY (Patient taking differently: Take 40 mg by mouth daily.)   tamsulosin (FLOMAX) 0.4 MG CAPS capsule Take 0.4 mg by mouth daily.     Allergies:   Patient has no known allergies.   Social History   Socioeconomic History   Marital status: Married    Spouse name: Not on file   Number of children: Not on file   Years of education: Not on file   Highest education level: Not on file  Occupational History   Not on file  Tobacco Use   Smoking status: Never   Smokeless tobacco: Never  Vaping Use   Vaping Use: Never used  Substance and Sexual Activity   Alcohol use: No   Drug use: No   Sexual activity: Not on file  Other Topics Concern   Not on file  Social History Narrative   Not on file   Social Determinants of Health   Financial Resource Strain: Not on file  Food Insecurity: Not on file  Transportation Needs: Not on file  Physical Activity: Not on file  Stress: Not on file  Social Connections: Not on file     Family History: The patient's family history includes Alzheimer's disease in his father; Blindness in his father; Glaucoma in his father; Heart disease in his mother; Hypertension in his father and mother. ROS:   Please see the history of present illness.    All 14 point review of systems negative except as described per history of present illness  EKGs/Labs/Other Studies Reviewed:      Recent Labs: No results found for requested labs within last 365 days.  Recent Lipid Panel    Component Value Date/Time   CHOL 104 12/31/2020 1055   TRIG 83 12/31/2020 1055   HDL 46 12/31/2020 1055   CHOLHDL 2.3 12/31/2020 1055   LDLCALC 41 12/31/2020 1055    Physical Exam:    VS:  BP 122/60 (BP Location: Left Arm, Patient Position: Sitting)   Pulse 73   Ht 6' (1.829 m)   Wt 191 lb 12.8 oz (87 kg)   SpO2 98%   BMI 26.01 kg/m     Wt  Readings from Last 3 Encounters:  12/30/21 191 lb 12.8 oz (87 kg)  07/05/21 188 lb (85.3 kg)  12/30/20 190 lb 12.8 oz (86.5 kg)     GEN:  Well nourished, well developed in no acute distress HEENT: Normal NECK: No JVD; No carotid bruits LYMPHATICS: No lymphadenopathy CARDIAC: RRR, no murmurs, no rubs, no gallops RESPIRATORY:  Clear to auscultation without rales, wheezing or rhonchi  ABDOMEN: Soft, non-tender, non-distended MUSCULOSKELETAL:  No edema; No deformity  SKIN: Warm and  dry LOWER EXTREMITIES: no swelling NEUROLOGIC:  Alert and oriented x 3 PSYCHIATRIC:  Normal affect   ASSESSMENT:    1. Coronary artery disease involving native coronary artery of native heart without angina pectoris   2. CAD S/P percutaneous coronary angioplasty   3. VT (ventricular tachycardia) ([place])   4. Essential hypertension   5. Status post ablation of atrial flutter   6. Mixed hyperlipidemia    PLAN:    In order of problems listed above:  Coronary disease.  Stable from that point we will continue present management. History of ventricular tachycardia asymptomatic denies have any palpitations.  We will continue present management. Elevated blood pressure but today normal I did review blood pressure results that she brought to me majority of time is acceptable. Dyslipidemia.  He is taking rosuvastatin as well as Zetia which I will continue, I did review K PN which show me total cholesterol 101 HDL 44, we will continue present management. Elevated heart rate.  We will do EKG today.  I also talked to him about cardia device and advised him to get one.   Medication Adjustments/Labs and Tests Ordered: Current medicines are reviewed at length with the patient today.  Concerns regarding medicines are outlined above.  No orders of the defined types were placed in this encounter.  Medication changes: No orders of the defined types were placed in this encounter.   Signed, Park Liter, MD,  [facility] 12/30/2021 3:45 PM    [place]

## 2022-01-13 ENCOUNTER — Other Ambulatory Visit: Payer: Self-pay | Admitting: Cardiology

## 2022-02-07 ENCOUNTER — Other Ambulatory Visit: Payer: Self-pay | Admitting: Cardiology

## 2022-03-11 ENCOUNTER — Ambulatory Visit: Payer: Medicare Other | Admitting: Cardiology

## 2022-05-01 ENCOUNTER — Other Ambulatory Visit: Payer: Self-pay | Admitting: Cardiology

## 2022-05-02 NOTE — Telephone Encounter (Signed)
Rx refill sent to pharmacy. 

## 2022-05-12 DIAGNOSIS — I4729 Other ventricular tachycardia: Secondary | ICD-10-CM | POA: Diagnosis not present

## 2022-05-12 DIAGNOSIS — N1831 Chronic kidney disease, stage 3a: Secondary | ICD-10-CM | POA: Diagnosis not present

## 2022-05-12 DIAGNOSIS — R7302 Impaired glucose tolerance (oral): Secondary | ICD-10-CM | POA: Diagnosis not present

## 2022-05-12 DIAGNOSIS — E782 Mixed hyperlipidemia: Secondary | ICD-10-CM | POA: Diagnosis not present

## 2022-05-12 DIAGNOSIS — I25119 Atherosclerotic heart disease of native coronary artery with unspecified angina pectoris: Secondary | ICD-10-CM | POA: Diagnosis not present

## 2022-05-12 DIAGNOSIS — Z79899 Other long term (current) drug therapy: Secondary | ICD-10-CM | POA: Diagnosis not present

## 2022-05-12 DIAGNOSIS — K2960 Other gastritis without bleeding: Secondary | ICD-10-CM | POA: Diagnosis not present

## 2022-05-12 DIAGNOSIS — N138 Other obstructive and reflux uropathy: Secondary | ICD-10-CM | POA: Diagnosis not present

## 2022-05-12 DIAGNOSIS — I10 Essential (primary) hypertension: Secondary | ICD-10-CM | POA: Diagnosis not present

## 2022-05-12 DIAGNOSIS — Z0000 Encounter for general adult medical examination without abnormal findings: Secondary | ICD-10-CM | POA: Diagnosis not present

## 2022-05-12 DIAGNOSIS — N3281 Overactive bladder: Secondary | ICD-10-CM | POA: Diagnosis not present

## 2022-06-08 ENCOUNTER — Other Ambulatory Visit: Payer: Self-pay | Admitting: Cardiology

## 2022-06-24 DIAGNOSIS — L570 Actinic keratosis: Secondary | ICD-10-CM | POA: Diagnosis not present

## 2022-06-24 DIAGNOSIS — D225 Melanocytic nevi of trunk: Secondary | ICD-10-CM | POA: Diagnosis not present

## 2022-06-24 DIAGNOSIS — L578 Other skin changes due to chronic exposure to nonionizing radiation: Secondary | ICD-10-CM | POA: Diagnosis not present

## 2022-06-24 DIAGNOSIS — L814 Other melanin hyperpigmentation: Secondary | ICD-10-CM | POA: Diagnosis not present

## 2022-06-24 DIAGNOSIS — L821 Other seborrheic keratosis: Secondary | ICD-10-CM | POA: Diagnosis not present

## 2022-06-28 ENCOUNTER — Ambulatory Visit: Payer: Medicare Other | Admitting: Cardiology

## 2022-06-29 ENCOUNTER — Other Ambulatory Visit: Payer: Self-pay

## 2022-06-30 ENCOUNTER — Ambulatory Visit: Payer: Medicare Other | Attending: Cardiology | Admitting: Cardiology

## 2022-06-30 ENCOUNTER — Encounter: Payer: Self-pay | Admitting: Cardiology

## 2022-06-30 VITALS — BP 130/70 | HR 73 | Ht 72.0 in | Wt 192.8 lb

## 2022-06-30 DIAGNOSIS — Z8679 Personal history of other diseases of the circulatory system: Secondary | ICD-10-CM

## 2022-06-30 DIAGNOSIS — R0609 Other forms of dyspnea: Secondary | ICD-10-CM

## 2022-06-30 DIAGNOSIS — Z9889 Other specified postprocedural states: Secondary | ICD-10-CM

## 2022-06-30 DIAGNOSIS — I10 Essential (primary) hypertension: Secondary | ICD-10-CM

## 2022-06-30 DIAGNOSIS — Z9861 Coronary angioplasty status: Secondary | ICD-10-CM | POA: Diagnosis not present

## 2022-06-30 DIAGNOSIS — I2510 Atherosclerotic heart disease of native coronary artery without angina pectoris: Secondary | ICD-10-CM

## 2022-06-30 NOTE — Progress Notes (Signed)
Cardiology Office Note:    Date:  06/30/2022   ID:  Scott Whitehead, DOB [birth year], MRN [id number]  PCP:  Street, Stephanie Coup, MD  Cardiologist:  Gypsy Balsam, MD    Referring MD: Street, Stephanie Coup, *   Chief Complaint  Patient presents with   Follow-up    History of Present Illness:    Scott Whitehead is a 82 y.o. male past medical history significant for coronary artery disease, he did have PTCA and stenting of the LAD many years ago, status post atrial flutter ablation, implantable loop recorder however battery was already depleted he does not want to remove it, dyslipidemia. Comes today to months for follow-up overall doing very well.  Denies have any chest pain tightness squeezing pressure burning chest, still busy walking with his dogs as well as taking care of his pool that he enjoyed.  He also got increase at times.  Described to have some fatigue tiredness sometimes  Past Medical History:  Diagnosis Date   Angina pectoris ([facility]) 09/09/2016   CAD S/P percutaneous coronary angioplasty    Stent to LAD many years ago   Cancer [facility])    Coronary artery disease involving native coronary artery 04/02/2015   Cardiac catheterization in the middle of 2018 showing nonobstructive lesion PET patent stent site   Dizziness 11/09/2016   Dyspnea on exertion 12/24/2018   Essential hypertension 04/28/2016   GERD (gastroesophageal reflux disease)    High risk medication use 06/05/2015   Hyperlipidemia    Hyperlipidemia with target low density lipoprotein (LDL) cholesterol less than 70 mg/dL 6/57/8469   Hypertension    Primary central sleep apnea 04/02/2015   Status post ablation of atrial flutter 04/02/2015   Status post placement of implantable loop recorder 06/02/2017   Battery already depleted patient does not want to have it removed   Syncope 04/02/2015   VT (ventricular tachycardia) ([facility]) 04/02/2015    Past Surgical History:  Procedure Laterality Date   CARDIAC SURGERY      heart monitor is placed in the heart area   ENDOVENOUS ABLATION SAPHENOUS VEIN W/ LASER Right 03/08/2018   endovenous laser ablation right greater saphenous vein by Waverly Ferrari MD    LEFT HEART CATH AND CORONARY ANGIOGRAPHY N/A 09/13/2016   Procedure: Left Heart Cath and Coronary Angiography;  Surgeon: Marykay Lex, MD;  Location: [facility] INVASIVE CV LAB;  Service: Cardiovascular;  Laterality: N/A;   LOOP RECORDER IMPLANT     stab phlebectomy  Right 08/23/2018   stab phlebectomy 10-20 incisions right leg by Chritopher Edilia Bo MD     Current Medications: Current Meds  Medication Sig   aspirin EC 81 MG tablet Take 81 mg by mouth daily.   diphenhydrAMINE (BENADRYL) 25 MG tablet Take 25 mg by mouth every 6 (six) hours as needed for sleep.   ezetimibe (ZETIA) 10 MG tablet TAKE 1 TABLET BY MOUTH DAILY   finasteride (PROSCAR) 5 MG tablet Take 1 tablet by mouth daily.   ibuprofen (ADVIL) 400 MG tablet Take 400 mg by mouth daily.   lisinopril (ZESTRIL) 2.5 MG tablet Take 2.5 mg by mouth daily.   MAGNESIUM PO Take 1 tablet by mouth daily. Unknown strength   Melatonin 10 MG TABS Take 1 tablet by mouth at bedtime as needed (sleep).   metoprolol tartrate (LOPRESSOR) 25 MG tablet Take 1 tablet (25 mg total) by mouth 2 (two) times daily.   Multiple Vitamin (MULTIVITAMIN WITH MINERALS) TABS tablet Take 1 tablet by mouth daily. Unknown strength  MYRBETRIQ 50 MG TB24 tablet Take 50 mg by mouth daily.   nitroGLYCERIN (NITROSTAT) 0.4 MG SL tablet Place 1 tablet (0.4 mg total) under the tongue every 5 (five) minutes as needed for chest pain.   Omega-3 Fatty Acids (FISH OIL) 1000 MG CAPS Take 1,000 mg by mouth 2 (two) times daily.    ranolazine (RANEXA) 500 MG 12 hr tablet Take 1 tablet (500 mg total) by mouth 2 (two) times daily.   rosuvastatin (CRESTOR) 40 MG tablet Take 1 tablet (40 mg total) by mouth daily.   tamsulosin (FLOMAX) 0.4 MG CAPS capsule Take 0.4 mg by mouth daily.     Allergies:    Patient has no known allergies.   Social History   Socioeconomic History   Marital status: Married    Spouse name: Not on file   Number of children: Not on file   Years of education: Not on file   Highest education level: Not on file  Occupational History   Not on file  Tobacco Use   Smoking status: Never   Smokeless tobacco: Never  Vaping Use   Vaping Use: Never used  Substance and Sexual Activity   Alcohol use: No   Drug use: No   Sexual activity: Not on file  Other Topics Concern   Not on file  Social History Narrative   Not on file   Social Determinants of Health   Financial Resource Strain: Not on file  Food Insecurity: Not on file  Transportation Needs: Not on file  Physical Activity: Not on file  Stress: Not on file  Social Connections: Not on file     Family History: The patient's family history includes Alzheimer's disease in his father; Blindness in his father; Glaucoma in his father; Heart disease in his mother; Hypertension in his father and mother. ROS:   Please see the history of present illness.    All 14 point review of systems negative except as described per history of present illness  EKGs/Labs/Other Studies Reviewed:      Recent Labs: No results found for requested labs within last 365 days.  Recent Lipid Panel    Component Value Date/Time   CHOL 104 12/31/2020 1055   TRIG 83 12/31/2020 1055   HDL 46 12/31/2020 1055   CHOLHDL 2.3 12/31/2020 1055   LDLCALC 41 12/31/2020 1055    Physical Exam:    VS:  BP 130/70 (BP Location: Left Arm, Patient Position: Sitting)   Pulse 73   Ht 6' (1.829 m)   Wt 192 lb 12.8 oz (87.5 kg)   SpO2 93%   BMI 26.15 kg/m     Wt Readings from Last 3 Encounters:  06/30/22 192 lb 12.8 oz (87.5 kg)  12/30/21 191 lb 12.8 oz (87 kg)  07/05/21 188 lb (85.3 kg)     GEN:  Well nourished, well developed in no acute distress HEENT: Normal NECK: No JVD; No carotid bruits LYMPHATICS: No  lymphadenopathy CARDIAC: RRR, no murmurs, no rubs, no gallops RESPIRATORY:  Clear to auscultation without rales, wheezing or rhonchi  ABDOMEN: Soft, non-tender, non-distended MUSCULOSKELETAL:  No edema; No deformity  SKIN: Warm and dry LOWER EXTREMITIES: no swelling NEUROLOGIC:  Alert and oriented x 3 PSYCHIATRIC:  Normal affect   ASSESSMENT:    1. CAD S/P percutaneous coronary angioplasty   2. Essential hypertension   3. Dyspnea on exertion   4. Status post ablation of atrial flutter    PLAN:    In order of problems  listed above:  Coronary disease stable from that point to an appropriate guideline directed medical therapy which I will continue. Essential hypertension blood pressure well-controlled continue present management. Dyslipidemia I did review his K PN which show me total cholesterol 108 HDL 42 LDL 41 continue present management. Weakness fatigue I will schedule him to have echocardiogram make sure he is left ventricle ejection fraction still preserved   Medication Adjustments/Labs and Tests Ordered: Current medicines are reviewed at length with the patient today.  Concerns regarding medicines are outlined above.  No orders of the defined types were placed in this encounter.  Medication changes: No orders of the defined types were placed in this encounter.   Signed, Georgeanna Lea, MD, [facility] 06/30/2022 11:54 AM    [facility]

## 2022-06-30 NOTE — Patient Instructions (Signed)
Medication Instructions:  Your physician recommends that you continue on your current medications as directed. Please refer to the Current Medication list given to you today.  *If you need a refill on your cardiac medications before your next appointment, please call your pharmacy*   Lab Work: None If you have labs (blood work) drawn today and your tests are completely normal, you will receive your results only by: MyChart Message (if you have MyChart) OR A paper copy in the mail If you have any lab test that is abnormal or we need to change your treatment, we will call you to review the results.   Testing/Procedures: Your physician has requested that you have an echocardiogram. Echocardiography is a painless test that uses sound waves to create images of your heart. It provides your doctor with information about the size and shape of your heart and how well your heart's chambers and valves are working. This procedure takes approximately one hour. There are no restrictions for this procedure. Please do NOT wear cologne, perfume, aftershave, or lotions (deodorant is allowed). Please arrive 15 minutes prior to your appointment time.    Follow-Up: At [facility], you and your health needs are our priority.  As part of our continuing mission to provide you with exceptional heart care, we have created designated Provider Care Teams.  These Care Teams include your primary Cardiologist (physician) and Advanced Practice Providers (APPs -  Physician Assistants and Nurse Practitioners) who all work together to provide you with the care you need, when you need it.  We recommend signing up for the patient portal called "MyChart".  Sign up information is provided on this After Visit Summary.  MyChart is used to connect with patients for Virtual Visits (Telemedicine).  Patients are able to view lab/test results, encounter notes, upcoming appointments, etc.  Non-urgent messages can be sent to your  provider as well.   To learn more about what you can do with MyChart, go to [email or web address].com.    Your next appointment:   6 month(s)  Provider:   Robert Krasowski, MD    Other Instructions Patient declined EKG chaperone  

## 2022-06-30 NOTE — Addendum Note (Signed)
Addended by: Roxanne Mins I on: 06/30/2022 12:03 PM   Modules accepted: Orders

## 2022-07-24 ENCOUNTER — Other Ambulatory Visit: Payer: Self-pay | Admitting: Cardiology

## 2022-07-25 ENCOUNTER — Ambulatory Visit: Payer: Medicare Other | Attending: Cardiology

## 2022-07-25 DIAGNOSIS — R0609 Other forms of dyspnea: Secondary | ICD-10-CM

## 2022-07-25 DIAGNOSIS — Z9889 Other specified postprocedural states: Secondary | ICD-10-CM

## 2022-07-25 DIAGNOSIS — I2510 Atherosclerotic heart disease of native coronary artery without angina pectoris: Secondary | ICD-10-CM

## 2022-07-25 DIAGNOSIS — Z9861 Coronary angioplasty status: Secondary | ICD-10-CM | POA: Diagnosis not present

## 2022-07-25 DIAGNOSIS — I10 Essential (primary) hypertension: Secondary | ICD-10-CM | POA: Diagnosis not present

## 2022-07-25 DIAGNOSIS — Z8679 Personal history of other diseases of the circulatory system: Secondary | ICD-10-CM

## 2022-07-25 LAB — ECHOCARDIOGRAM COMPLETE
P 1/2 time: 995 msec
S' Lateral: 3.6 cm

## 2022-08-01 ENCOUNTER — Telehealth: Payer: Self-pay

## 2022-08-01 NOTE — Telephone Encounter (Signed)
Left message on My Chart with normal results per Dr. Krasowski's note. Routed to PCP. 

## 2022-08-19 ENCOUNTER — Other Ambulatory Visit: Payer: Self-pay | Admitting: Cardiology

## 2022-09-21 ENCOUNTER — Other Ambulatory Visit: Payer: Self-pay | Admitting: Cardiology

## 2022-09-28 DIAGNOSIS — H40013 Open angle with borderline findings, low risk, bilateral: Secondary | ICD-10-CM | POA: Diagnosis not present

## 2022-09-28 DIAGNOSIS — H52223 Regular astigmatism, bilateral: Secondary | ICD-10-CM | POA: Diagnosis not present

## 2022-09-28 DIAGNOSIS — H524 Presbyopia: Secondary | ICD-10-CM | POA: Diagnosis not present

## 2022-10-06 ENCOUNTER — Other Ambulatory Visit: Payer: Self-pay | Admitting: Cardiology

## 2022-11-17 ENCOUNTER — Telehealth: Payer: Self-pay

## 2022-11-17 ENCOUNTER — Telehealth: Payer: Self-pay | Admitting: Cardiology

## 2022-11-17 NOTE — Telephone Encounter (Signed)
Pt transferred by call center c/o lightheadedness, low BP and pulse. He reported BP's and pulse rates over the last 2 days: 100/64-51, 97/64-54, 100/59-53, 101/70-47, 109/72-70, 125/61-40, 124/70-60. He said he has not changed anything and is on the same medications. The lightheadedness comes and goes. No other symptoms reported. Please Advise.

## 2022-11-17 NOTE — Telephone Encounter (Signed)
Spoke with pt regarding Dr. Hulen Shouts note. Pt stated that Dr. Bing Matter takes care of his HTN and he would like appt here. Sent to front desk for appt.

## 2022-11-17 NOTE — Telephone Encounter (Signed)
Message taken and sent to Dr. Dulce Sellar

## 2022-11-17 NOTE — Telephone Encounter (Signed)
STAT if patient feels like he/she is going to faint   Are you dizzy now?  Patient states he feels lightheaded right now  Do you feel faint or have you passed out?  No   Do you have any other symptoms?  No   Have you checked your HR and BP (record if available)?  101/52 49 at about 7:30 AM

## 2022-11-19 ENCOUNTER — Other Ambulatory Visit: Payer: Self-pay | Admitting: Cardiology

## 2022-11-23 DIAGNOSIS — H905 Unspecified sensorineural hearing loss: Secondary | ICD-10-CM | POA: Diagnosis not present

## 2022-11-23 NOTE — Progress Notes (Unsigned)
pericardial effusion.  Mitral Valve: The mitral valve is normal in structure. No evidence of mitral valve regurgitation. No evidence of mitral valve stenosis.  Tricuspid Valve: The tricuspid valve is normal in structure. Tricuspid valve regurgitation is trivial. No evidence of tricuspid stenosis.  Aortic Valve: The aortic valve is calcified. There is mild calcification of the aortic valve. There is mild thickening of the aortic valve. Aortic valve regurgitation is  trivial. Aortic regurgitation PHT measures 995 msec. Aortic valve sclerosis/calcification is present, without any evidence of aortic stenosis.  Pulmonic Valve: The pulmonic valve was normal in structure. Pulmonic valve regurgitation is not visualized. No evidence of pulmonic stenosis.  Aorta: The aortic root is normal in size and structure.  Venous: The inferior vena cava is normal in size with greater than 50% respiratory variability, suggesting right atrial pressure of 3 mmHg.  IAS/Shunts: No atrial level shunt detected by color flow Doppler.   LEFT VENTRICLE PLAX 2D LVIDd:         5.60 cm   Diastology LVIDs:         3.60 cm   LV e' medial:    7.18 cm/s LV PW:         0.90 cm   LV E/e' medial:  12.3 LV IVS:        0.90 cm   LV e' lateral:   6.53 cm/s LVOT diam:     2.00 cm   LV E/e' lateral: 13.5 LV SV:         87 LV SV Index:   41        2D Longitudinal Strain LVOT Area:     3.14 cm  2D Strain GLS Avg:     -19.8 %   RIGHT VENTRICLE             IVC RV Basal diam:  3.50 cm     IVC diam: 1.50 cm RV S prime:     13.10 cm/s TAPSE (M-mode): 2.6 cm  LEFT ATRIUM             Index        RIGHT ATRIUM           Index LA diam:        4.00 cm 1.91 cm/m   RA Area:     18.90 cm LA Vol (A2C):   63.2 ml 30.13 ml/m  RA Volume:   51.60 ml  24.60 ml/m LA Vol (A4C):   51.9 ml 24.74 ml/m LA Biplane Vol: 57.4 ml 27.36 ml/m AORTIC VALVE LVOT Vmax:   111.00 cm/s LVOT Vmean:  71.750 cm/s LVOT VTI:    0.276 m AI PHT:      995 msec  AORTA Ao Root diam: 3.50 cm Ao Asc diam:  3.20 cm Ao Desc diam: 2.40 cm  MV E velocity: 88.00 cm/s  TRICUSPID VALVE MV A velocity: 91.20 cm/s  TR Peak grad:   12.5 mmHg MV E/A ratio:  0.96        TR Vmax:        177.00 cm/s  SHUNTS Systemic VTI:  0.28 m Systemic Diam: 2.00 cm  Gypsy Balsam MD Electronically signed by Gypsy Balsam MD Signature Date/Time: 07/25/2022/5:00:42 PM    Final    MONITORS  LONG TERM MONITOR (3-14 DAYS)  06/26/2019  Narrative Scott Whitehead, DOB 1940/10/27, MRN [id number]  HOLTER MONITOR REPORT:    Date of test:  Cardiology Office Note:  .   Date:  11/23/2022  ID:  Scott Whitehead, DOB 1941-02-06, MRN [id number] PCP: Street, Stephanie Coup, MD  [facility] Providers Cardiologist:  None { Click to update primary MD,subspecialty MD or APP then REFRESH:1}   History of Present Illness: .   Scott Whitehead is a 82 y.o. male with a past medical history of CAD s/p DES to LAD > 10 years ago, atrial flutter s/p ablation, NSVT, hypertension, GERD, dyslipidemia, presence of ILR.  07/25/2022 echo EF 60 to 65%, grade 2 DD, mild calcification arctic valve with mild thickening and sclerosis without evidence of stenosis 01/29/2020 echo EF 50 to 55%, grade 1 DD, no valvular abnormalities 05/30/2019 monitor average heart rate 75 bpm, 3 rounds of wide-complex tachycardia 01/03/2019 Lexiscan findings consistent with prior MI with no evidence of peri-infarct ischemia, intermediate risk 09/13/2016 left heart cath revealed minimal disease with patent stent in the LAD  Most recently evaluated by Dr. Bing Matter on 06/30/2022, he was staying relatively active at home although he did endorse some fatigue and tiredness at times. An echocardiogram was arranged which revealed an EF of 60 to 65%, grade 2 DD, mild calcification aortic valve with mild thickening and sclerosis without evidence of stenosis.  He contacted triage with complaints of dizziness, heart rate was around 49--stop his metoprolol CMET, TSH, CBC, Mag, stop BB, monitor  ROS: ROS   Studies Reviewed: .        Cardiac Studies & Procedures   CARDIAC CATHETERIZATION  CARDIAC CATHETERIZATION 09/13/2016  Narrative Images from the original result were not included.   The left ventricular systolic function is normal. The left ventricular ejection fraction is 50-55% by visual estimate.  LV end diastolic pressure is moderately elevated.  Mid LAD DES stent, 0 %stenosed.  Prox LAD lesion, 40 %stenosed - prior to stent  1st Diag lesion, 40  %stenosed.  Angiographically minimal disease with patent stent in the LAD. No significant lesions to explain the patient's symptoms. He does have moderately elevated LVEDP suggestive of diastolic dysfunction which could explain some dyspnea.  Plan: Return to primary cardiologist for additional evaluation. He will be discharged home today after TR band removal.     Bryan Lemma, M.D., M.S. Interventional Cardiologist  Pager # [phone] Phone # [phone] [address] [place], [place] [address]  Findings Coronary Findings Diagnostic  Dominance: Right  Left Main Vessel is large.  Left Anterior Descending Vessel is angiographically normal. The lesion is discrete. Previously placed Mid LAD drug eluting stent is widely patent.  First Diagonal Branch Vessel is moderate in size. The lesion is tubular and smooth.  First Septal Branch Vessel is moderate in size.  Second Septal Branch Vessel is small in size.  Third Septal Branch Vessel is small in size.  Ramus Intermedius Vessel is normal in caliber.  Left Circumflex Vessel is angiographically normal.  First Obtuse Marginal Branch Vessel is moderate in size. Vessel is angiographically normal. The vessel is tortuous.  Second Obtuse Marginal Branch Vessel is small in size. Vessel is angiographically normal.  Right Coronary Artery Vessel is large. Vessel is angiographically normal.  Acute Marginal Branch Vessel is moderate in size.  Right Posterior Descending Artery Vessel is moderate in size. Vessel is angiographically normal.  Right Posterior Atrioventricular Artery Vessel is moderate in size. Vessel is angiographically normal.  First Right Posterolateral Branch Vessel is small in size.  Intervention  No interventions have been documented.   STRESS TESTS  MYOCARDIAL PERFUSION IMAGING  pericardial effusion.  Mitral Valve: The mitral valve is normal in structure. No evidence of mitral valve regurgitation. No evidence of mitral valve stenosis.  Tricuspid Valve: The tricuspid valve is normal in structure. Tricuspid valve regurgitation is trivial. No evidence of tricuspid stenosis.  Aortic Valve: The aortic valve is calcified. There is mild calcification of the aortic valve. There is mild thickening of the aortic valve. Aortic valve regurgitation is  trivial. Aortic regurgitation PHT measures 995 msec. Aortic valve sclerosis/calcification is present, without any evidence of aortic stenosis.  Pulmonic Valve: The pulmonic valve was normal in structure. Pulmonic valve regurgitation is not visualized. No evidence of pulmonic stenosis.  Aorta: The aortic root is normal in size and structure.  Venous: The inferior vena cava is normal in size with greater than 50% respiratory variability, suggesting right atrial pressure of 3 mmHg.  IAS/Shunts: No atrial level shunt detected by color flow Doppler.   LEFT VENTRICLE PLAX 2D LVIDd:         5.60 cm   Diastology LVIDs:         3.60 cm   LV e' medial:    7.18 cm/s LV PW:         0.90 cm   LV E/e' medial:  12.3 LV IVS:        0.90 cm   LV e' lateral:   6.53 cm/s LVOT diam:     2.00 cm   LV E/e' lateral: 13.5 LV SV:         87 LV SV Index:   41        2D Longitudinal Strain LVOT Area:     3.14 cm  2D Strain GLS Avg:     -19.8 %   RIGHT VENTRICLE             IVC RV Basal diam:  3.50 cm     IVC diam: 1.50 cm RV S prime:     13.10 cm/s TAPSE (M-mode): 2.6 cm  LEFT ATRIUM             Index        RIGHT ATRIUM           Index LA diam:        4.00 cm 1.91 cm/m   RA Area:     18.90 cm LA Vol (A2C):   63.2 ml 30.13 ml/m  RA Volume:   51.60 ml  24.60 ml/m LA Vol (A4C):   51.9 ml 24.74 ml/m LA Biplane Vol: 57.4 ml 27.36 ml/m AORTIC VALVE LVOT Vmax:   111.00 cm/s LVOT Vmean:  71.750 cm/s LVOT VTI:    0.276 m AI PHT:      995 msec  AORTA Ao Root diam: 3.50 cm Ao Asc diam:  3.20 cm Ao Desc diam: 2.40 cm  MV E velocity: 88.00 cm/s  TRICUSPID VALVE MV A velocity: 91.20 cm/s  TR Peak grad:   12.5 mmHg MV E/A ratio:  0.96        TR Vmax:        177.00 cm/s  SHUNTS Systemic VTI:  0.28 m Systemic Diam: 2.00 cm  Gypsy Balsam MD Electronically signed by Gypsy Balsam MD Signature Date/Time: 07/25/2022/5:00:42 PM    Final    MONITORS  LONG TERM MONITOR (3-14 DAYS)  06/26/2019  Narrative Scott Whitehead, DOB 1940/10/27, MRN [id number]  HOLTER MONITOR REPORT:    Date of test:  01/03/2019  Narrative  The left ventricular ejection fraction is hyperdynamic (>65%).  Nuclear  stress EF: 70%.  Defect 1: There is a medium defect of moderate severity present in the basal inferior location with mild hypokinesis.  Findings consistent with prior myocardial infarction with no evidence of peri-infarct ischemia.  This is an intermediate risk study.   ECHOCARDIOGRAM  ECHOCARDIOGRAM COMPLETE 07/25/2022  Narrative ECHOCARDIOGRAM REPORT    Patient Name:   Scott Whitehead Date of Exam: 07/25/2022 Medical Rec #:  [id number]       Height:       72.0 in Accession #:    [id number]      Weight:       192.8 lb Date of Birth:  1940-05-03       BSA:          2.098 m Patient Age:    81 years        BP:           130/70 mmHg Patient Gender: M               HR:           60 bpm. Exam Location:  [place]  Procedure: 2D Echo, Cardiac Doppler, Color Doppler and Strain Analysis  Indications:    CAD S/P percutaneous coronary angioplasty [I25.10, Z98.61 (ICD-10-CM)]; Essential hypertension [I10 (ICD-10-CM)]; Dyspnea on exertion [R06.09 (ICD-10-CM)]; Status post ablation of atrial flutter [Z98.890, Z86.79 (ICD-10-CM)]  History:        Patient has prior history of Echocardiogram examinations, most recent 01/29/2020. CAD, Signs/Symptoms:Syncope; Risk Factors:Dyslipidemia and Diabetes.  Sonographer:    Louie Boston RDCS Referring Phys: [phone] Marveen Reeks KRASOWSKI  IMPRESSIONS   1. Left ventricular ejection fraction, by estimation, is 60 to 65%. The left ventricle has normal function. The left ventricle has no regional wall motion abnormalities. Left ventricular diastolic parameters are consistent with Grade II diastolic dysfunction (pseudonormalization). The average left ventricular global longitudinal strain is -19.8 %. The global longitudinal strain is normal. 2. Right ventricular systolic function is normal. The right ventricular size is normal. There is normal pulmonary artery systolic pressure. 3. The mitral valve is normal in structure. No evidence of mitral valve regurgitation.  No evidence of mitral stenosis. 4. The aortic valve is calcified. There is mild calcification of the aortic valve. There is mild thickening of the aortic valve. Aortic valve regurgitation is trivial. Aortic valve sclerosis/calcification is present, without any evidence of aortic stenosis. 5. The inferior vena cava is normal in size with greater than 50% respiratory variability, suggesting right atrial pressure of 3 mmHg.  FINDINGS Left Ventricle: Left ventricular ejection fraction, by estimation, is 60 to 65%. The left ventricle has normal function. The left ventricle has no regional wall motion abnormalities. The average left ventricular global longitudinal strain is -19.8 %. The global longitudinal strain is normal. The left ventricular internal cavity size was normal in size. There is no left ventricular hypertrophy. Left ventricular diastolic parameters are consistent with Grade II diastolic dysfunction (pseudonormalization).  Right Ventricle: The right ventricular size is normal. No increase in right ventricular wall thickness. Right ventricular systolic function is normal. There is normal pulmonary artery systolic pressure. The tricuspid regurgitant velocity is 1.77 m/s, and with an assumed right atrial pressure of 3 mmHg, the estimated right ventricular systolic pressure is 15.5 mmHg.  Left Atrium: Left atrial size was normal in size.  Right Atrium: Right atrial size was normal in size.  Pericardium: There is no evidence of

## 2022-11-24 ENCOUNTER — Ambulatory Visit: Payer: Medicare Other

## 2022-11-24 ENCOUNTER — Encounter: Payer: Self-pay | Admitting: Cardiology

## 2022-11-24 ENCOUNTER — Ambulatory Visit: Payer: Medicare Other | Attending: Cardiology | Admitting: Cardiology

## 2022-11-24 VITALS — BP 122/60 | HR 65 | Ht 72.0 in | Wt 190.8 lb

## 2022-11-24 DIAGNOSIS — I472 Ventricular tachycardia, unspecified: Secondary | ICD-10-CM | POA: Diagnosis not present

## 2022-11-24 DIAGNOSIS — Z9861 Coronary angioplasty status: Secondary | ICD-10-CM

## 2022-11-24 DIAGNOSIS — Z9889 Other specified postprocedural states: Secondary | ICD-10-CM | POA: Diagnosis not present

## 2022-11-24 DIAGNOSIS — R001 Bradycardia, unspecified: Secondary | ICD-10-CM

## 2022-11-24 DIAGNOSIS — Z8679 Personal history of other diseases of the circulatory system: Secondary | ICD-10-CM

## 2022-11-24 DIAGNOSIS — I10 Essential (primary) hypertension: Secondary | ICD-10-CM

## 2022-11-24 DIAGNOSIS — R002 Palpitations: Secondary | ICD-10-CM | POA: Diagnosis not present

## 2022-11-24 DIAGNOSIS — E782 Mixed hyperlipidemia: Secondary | ICD-10-CM | POA: Diagnosis not present

## 2022-11-24 DIAGNOSIS — I2510 Atherosclerotic heart disease of native coronary artery without angina pectoris: Secondary | ICD-10-CM | POA: Diagnosis not present

## 2022-11-24 NOTE — Patient Instructions (Signed)
Medication Instructions:  Stop Metoprolol   *If you need a refill on your cardiac medications before your next appointment, please call your pharmacy*   Lab Work: CMET, CBC, TSH, MAG - Today   If you have labs (blood work) drawn today and your tests are completely normal, you will receive your results only by: MyChart Message (if you have MyChart) OR A paper copy in the mail If you have any lab test that is abnormal or we need to change your treatment, we will call you to review the results.   Testing/Procedures: A zio monitor was ordered today. It will remain on for 14 days. You will then return monitor and event diary in provided box. It takes 1-2 weeks for report to be downloaded and returned to [place]. We will call you with the results. If monitor falls off or has orange flashing light, please call [employer] for further instructions.     Follow-Up: At [facility], you and your health needs are our priority.  As part of our continuing mission to provide you with exceptional heart care, we have created designated Provider Care Teams.  These Care Teams include your primary Cardiologist (physician) and Advanced Practice Providers (APPs -  Physician Assistants and Nurse Practitioners) who all work together to provide you with the care you need, when you need it.  We recommend signing up for the patient portal called "MyChart".  Sign up information is provided on this After Visit Summary.  MyChart is used to connect with patients for Virtual Visits (Telemedicine).  Patients are able to view lab/test results, encounter notes, upcoming appointments, etc.  Non-urgent messages can be sent to your provider as well.   To learn more about what you can do with MyChart, go to [email or web address].    Your next appointment:   6 week(s)  Provider:   Gypsy Balsam, MD    Other Instructions Christena Deem- Long Term Monitor Instructions  Your physician has requested you wear a ZIO patch monitor for  14 days.  This is a single patch monitor. [employer] supplies one patch monitor per enrollment. Additional stickers are not available. Please do not apply patch if you will be having a Nuclear Stress Test,  Echocardiogram, Cardiac CT, MRI, or Chest Xray during the period you would be wearing the  monitor. The patch cannot be worn during these tests. You cannot remove and re-apply the  ZIO XT patch monitor.  Your ZIO patch monitor will be mailed 3 day USPS to your address on file. It may take 3-5 days  to receive your monitor after you have been enrolled.  Once you have received your monitor, please review the enclosed instructions. Your monitor  has already been registered assigning a specific monitor serial # to you.  Billing and Patient Assistance Program Information  We have supplied [employer] with any of your insurance information on file for billing purposes. [employer] offers a sliding scale Patient Assistance Program for patients that do not have  insurance, or whose insurance does not completely cover the cost of the ZIO monitor.  You must apply for the Patient Assistance Program to qualify for this discounted rate.  To apply, please call [employer] at [phone], select option 4, select option 2, ask to apply for  Patient Assistance Program. Meredeth Ide will ask your household income, and how many people  are in your household. They will quote your out-of-pocket cost based on that information.  [employer] will also be able to set up a 49-month,  interest-free payment plan if needed.  Applying the monitor   Shave hair from upper left chest.  Hold abrader disc by orange tab. Rub abrader in 40 strokes over the upper left chest as  indicated in your monitor instructions.  Clean area with 4 enclosed alcohol pads. Let dry.  Apply patch as indicated in monitor instructions. Patch will be placed under collarbone on left  side of chest with arrow pointing upward.  Rub patch adhesive wings for 2 minutes.  Remove white label marked "1". Remove the white  label marked "2". Rub patch adhesive wings for 2 additional minutes.  While looking in a mirror, press and release button in center of patch. A small green light will  flash 3-4 times. This will be your only indicator that the monitor has been turned on.  Do not shower for the first 24 hours. You may shower after the first 24 hours.  Press the button if you feel a symptom. You will hear a small click. Record Date, Time and  Symptom in the Patient Logbook.  When you are ready to remove the patch, follow instructions on the last 2 pages of Patient  Logbook. Stick patch monitor onto the last page of Patient Logbook.  Place Patient Logbook in the blue and white box. Use locking tab on box and tape box closed  securely. The blue and white box has prepaid postage on it. Please place it in the mailbox as  soon as possible. Your physician should have your test results approximately 7 days after the  monitor has been mailed back to [facility].  Call [facility] Customer Care at [phone] if you have questions regarding  your ZIO XT patch monitor. Call them immediately if you see an orange light blinking on your  monitor.  If your monitor falls off in less than 4 days, contact our Monitor department at [phone].  If your monitor becomes loose or falls off after 4 days call [employer] at [phone] for  suggestions on securing your monitor

## 2022-11-25 LAB — COMPREHENSIVE METABOLIC PANEL WITH GFR
ALT: 13 IU/L (ref 0–44)
AST: 20 IU/L (ref 0–40)
Albumin: 4.1 g/dL (ref 3.7–4.7)
Alkaline Phosphatase: 64 IU/L (ref 44–121)
BUN/Creatinine Ratio: 15 (ref 10–24)
BUN: 21 mg/dL (ref 8–27)
Bilirubin Total: 0.5 mg/dL (ref 0.0–1.2)
CO2: 24 mmol/L (ref 20–29)
Calcium: 9.1 mg/dL (ref 8.6–10.2)
Chloride: 104 mmol/L (ref 96–106)
Creatinine, Ser: 1.38 mg/dL — ABNORMAL HIGH (ref 0.76–1.27)
Globulin, Total: 1.8 g/dL (ref 1.5–4.5)
Glucose: 89 mg/dL (ref 70–99)
Potassium: 4.5 mmol/L (ref 3.5–5.2)
Sodium: 141 mmol/L (ref 134–144)
Total Protein: 5.9 g/dL — ABNORMAL LOW (ref 6.0–8.5)
eGFR: 51 mL/min/1.73 — ABNORMAL LOW

## 2022-11-25 LAB — CBC WITH DIFFERENTIAL/PLATELET
Basophils Absolute: 0 x10E3/uL (ref 0.0–0.2)
Basos: 1 %
EOS (ABSOLUTE): 0.3 x10E3/uL (ref 0.0–0.4)
Eos: 6 %
Hematocrit: 39.8 % (ref 37.5–51.0)
Hemoglobin: 12.9 g/dL — ABNORMAL LOW (ref 13.0–17.7)
Immature Grans (Abs): 0 x10E3/uL (ref 0.0–0.1)
Immature Granulocytes: 0 %
Lymphocytes Absolute: 1.1 x10E3/uL (ref 0.7–3.1)
Lymphs: 19 %
MCH: 33.3 pg — ABNORMAL HIGH (ref 26.6–33.0)
MCHC: 32.4 g/dL (ref 31.5–35.7)
MCV: 103 fL — ABNORMAL HIGH (ref 79–97)
Monocytes Absolute: 0.5 x10E3/uL (ref 0.1–0.9)
Monocytes: 8 %
Neutrophils Absolute: 4 x10E3/uL (ref 1.4–7.0)
Neutrophils: 66 %
Platelets: 180 x10E3/uL (ref 150–450)
RBC: 3.87 x10E6/uL — ABNORMAL LOW (ref 4.14–5.80)
RDW: 12.9 % (ref 11.6–15.4)
WBC: 6 x10E3/uL (ref 3.4–10.8)

## 2022-11-25 LAB — T4, FREE: Free T4: 0.95 ng/dL (ref 0.82–1.77)

## 2022-11-25 LAB — MAGNESIUM: Magnesium: 2 mg/dL (ref 1.6–2.3)

## 2022-11-25 LAB — T3, FREE: T3, Free: 3 pg/mL (ref 2.0–4.4)

## 2022-11-25 LAB — TSH: TSH: 2.26 u[IU]/mL (ref 0.450–4.500)

## 2022-12-15 DIAGNOSIS — R001 Bradycardia, unspecified: Secondary | ICD-10-CM | POA: Diagnosis not present

## 2022-12-15 DIAGNOSIS — I472 Ventricular tachycardia, unspecified: Secondary | ICD-10-CM | POA: Diagnosis not present

## 2022-12-29 ENCOUNTER — Other Ambulatory Visit: Payer: Self-pay | Admitting: Cardiology

## 2023-01-05 ENCOUNTER — Encounter: Payer: Self-pay | Admitting: Cardiology

## 2023-01-05 ENCOUNTER — Ambulatory Visit: Payer: Medicare Other | Attending: Cardiology | Admitting: Cardiology

## 2023-01-05 VITALS — BP 116/64 | HR 38 | Ht 72.0 in | Wt 193.2 lb

## 2023-01-05 DIAGNOSIS — E785 Hyperlipidemia, unspecified: Secondary | ICD-10-CM | POA: Diagnosis not present

## 2023-01-05 DIAGNOSIS — Z8679 Personal history of other diseases of the circulatory system: Secondary | ICD-10-CM

## 2023-01-05 DIAGNOSIS — I472 Ventricular tachycardia, unspecified: Secondary | ICD-10-CM | POA: Diagnosis not present

## 2023-01-05 DIAGNOSIS — Z9861 Coronary angioplasty status: Secondary | ICD-10-CM | POA: Diagnosis not present

## 2023-01-05 DIAGNOSIS — Z95818 Presence of other cardiac implants and grafts: Secondary | ICD-10-CM

## 2023-01-05 DIAGNOSIS — I2510 Atherosclerotic heart disease of native coronary artery without angina pectoris: Secondary | ICD-10-CM

## 2023-01-05 DIAGNOSIS — Z9889 Other specified postprocedural states: Secondary | ICD-10-CM | POA: Diagnosis not present

## 2023-01-05 DIAGNOSIS — I493 Ventricular premature depolarization: Secondary | ICD-10-CM

## 2023-01-05 HISTORY — DX: Ventricular premature depolarization: I49.3

## 2023-01-05 MED ORDER — METOPROLOL TARTRATE 25 MG PO TABS: 12.5000 mg | ORAL_TABLET | Freq: Two times a day (BID) | ORAL | 3 refills | Status: DC

## 2023-01-05 NOTE — Progress Notes (Signed)
Cardiology Office Note:    Date:  01/05/2023   ID:  Scott Whitehead, DOB 02/01/1941, MRN [id number]  PCP:  Street, Stephanie Coup, MD  Cardiologist:  Gypsy Balsam, MD    Referring MD: Street, Stephanie Coup, *   Chief Complaint  Patient presents with   Results    History of Present Illness:    Scott Whitehead is a 82 y.o. male with past medical history significant for coronary artery disease he did have PTCA and stenting of the LAD done many years ago, status post atrial flutter ablation done by Dr. Jeanne Ivan, implantable loop recorder however battery is already depleted he did not want to remove it, also history of dyslipidemia.  For last few months he has been experiencing episode of shortness of breath and fatigue and tiredness.  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  Many times when he does have bigeminy he is symptomatic in the matter-of-fact today he is in the office and bigeminy and he feels lousy.  He tells me that sometimes he can climb stairs with 0 problems and another time he can barely make it.  I suspect the reason for his poor feeling is frequent ventricular ectopy and we need to try to suppress it.  He denies have any chest pain tightness squeezing pressure burning chest  Past Medical History:  Diagnosis Date   Angina pectoris (HCC) 09/09/2016   CAD S/P percutaneous coronary angioplasty    Stent to LAD many years ago   Cancer [facility])    Coronary artery disease involving native coronary artery 04/02/2015   Cardiac catheterization in the middle of 2018 showing nonobstructive lesion PET patent stent site   Dizziness 11/09/2016   Dyspnea on exertion 12/24/2018   Essential hypertension 04/28/2016   GERD (gastroesophageal reflux disease)    High risk medication use 06/05/2015   Hyperlipidemia    Hyperlipidemia with target low density lipoprotein (LDL) cholesterol less than 70 mg/dL 8/65/7846   Hypertension    Primary central sleep  apnea 04/02/2015   Status post ablation of atrial flutter 04/02/2015   Status post placement of implantable loop recorder 06/02/2017   Battery already depleted patient does not want to have it removed   Syncope 04/02/2015   VT (ventricular tachycardia) (HCC) 04/02/2015    Past Surgical History:  Procedure Laterality Date   CARDIAC SURGERY     heart monitor is placed in the heart area   ENDOVENOUS ABLATION SAPHENOUS VEIN W/ LASER Right 03/08/2018   endovenous laser ablation right greater saphenous vein by Waverly Ferrari MD    LEFT HEART CATH AND CORONARY ANGIOGRAPHY N/A 09/13/2016   Procedure: Left Heart Cath and Coronary Angiography;  Surgeon: Marykay Lex, MD;  Location: [facility] INVASIVE CV LAB;  Service: Cardiovascular;  Laterality: N/A;   LOOP RECORDER IMPLANT     stab phlebectomy  Right 08/23/2018   stab phlebectomy 10-20 incisions right leg by Chritopher Edilia Bo MD     Current Medications: Current Meds  Medication Sig   aspirin EC 81 MG tablet Take 81 mg by mouth daily.   diphenhydrAMINE (BENADRYL) 25 MG tablet Take 25 mg by mouth every 6 (six) hours as needed for sleep.   ezetimibe (ZETIA) 10 MG tablet Take 1 tablet (10 mg total) by mouth daily.   finasteride (PROSCAR) 5 MG tablet Take 1 tablet by mouth daily.   ibuprofen (ADVIL) 400 MG tablet Take 400 mg by mouth daily.   lisinopril (ZESTRIL) 2.5 MG  tablet Take 2.5 mg by mouth daily.   MAGNESIUM PO Take 1 tablet by mouth daily. Unknown strength   Melatonin 10 MG TABS Take 1 tablet by mouth at bedtime as needed (sleep).   Multiple Vitamin (MULTIVITAMIN WITH MINERALS) TABS tablet Take 1 tablet by mouth daily. Unknown strength   MYRBETRIQ 50 MG TB24 tablet Take 50 mg by mouth daily.   nitroGLYCERIN (NITROSTAT) 0.4 MG SL tablet Place 1 tablet (0.4 mg total) under the tongue every 5 (five) minutes as needed for chest pain.   Omega-3 Fatty Acids (FISH OIL) 1000 MG CAPS Take 1,000 mg by mouth 2 (two) times daily.    ranolazine  (RANEXA) 500 MG 12 hr tablet Take 1 tablet (500 mg total) by mouth 2 (two) times daily.   rosuvastatin (CRESTOR) 40 MG tablet TAKE 1 TABLET BY MOUTH DAILY   tamsulosin (FLOMAX) 0.4 MG CAPS capsule Take 0.4 mg by mouth daily.     Allergies:   Patient has no known allergies.   Social History   Socioeconomic History   Marital status: Married    Spouse name: Not on file   Number of children: Not on file   Years of education: Not on file   Highest education level: Not on file  Occupational History   Not on file  Tobacco Use   Smoking status: Never   Smokeless tobacco: Never  Vaping Use   Vaping status: Never Used  Substance and Sexual Activity   Alcohol use: No   Drug use: No   Sexual activity: Not on file  Other Topics Concern   Not on file  Social History Narrative   Not on file   Social Determinants of Health   Financial Resource Strain: Not on file  Food Insecurity: Not on file  Transportation Needs: Not on file  Physical Activity: Not on file  Stress: No Stress Concern Present (07/28/2020)   Received from [facility]   [employer] [facility] - Occupational Stress Questionnaire    Feeling of Stress : Not at all  Social Connections: Unknown (06/29/2021)   Received from [facility]   Social Network    Social Network: Not on file     Family History: The patient's family history includes Alzheimer's disease in his father; Blindness in his father; Glaucoma in his father; Heart disease in his mother; Hypertension in his father and mother. ROS:   Please see the history of present illness.    All 14 point review of systems negative except as described per history of present illness  EKGs/Labs/Other Studies Reviewed:         Recent Labs: 11/24/2022: ALT 13; BUN 21; Creatinine, Ser 1.38; Hemoglobin 12.9; Magnesium 2.0; Platelets 180; Potassium 4.5; Sodium 141; TSH 2.260  Recent Lipid Panel    Component Value  Date/Time   CHOL 104 12/31/2020 1055   TRIG 83 12/31/2020 1055   HDL 46 12/31/2020 1055   CHOLHDL 2.3 12/31/2020 1055   LDLCALC 41 12/31/2020 1055    Physical Exam:    VS:  BP 116/64 (BP Location: Left Arm, Patient Position: Sitting)   Pulse (!) 38   Ht 6' (1.829 m)   Wt 193 lb 3.2 oz (87.6 kg)   SpO2 94%   BMI 26.20 kg/m     Wt Readings from Last 3 Encounters:  01/05/23 193 lb 3.2 oz (87.6 kg)  11/24/22 190 lb 12.8 oz (86.5 kg)  06/30/22 192 lb 12.8 oz (87.5 kg)  GEN:  Well nourished, well developed in no acute distress HEENT: Normal NECK: No JVD; No carotid bruits LYMPHATICS: No lymphadenopathy CARDIAC: RRR, no murmurs, no rubs, no gallops RESPIRATORY:  Clear to auscultation without rales, wheezing or rhonchi  ABDOMEN: Soft, non-tender, non-distended MUSCULOSKELETAL:  No edema; No deformity  SKIN: Warm and dry LOWER EXTREMITIES: no swelling NEUROLOGIC:  Alert and oriented x 3 PSYCHIATRIC:  Normal affect   ASSESSMENT:    1. Ventricular ectopy   2. CAD S/P percutaneous coronary angioplasty   3. Hyperlipidemia with target low density lipoprotein (LDL) cholesterol less than 70 mg/dL   4. Status post ablation of atrial flutter   5. 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  Plan will be to check his electrolytes today, he is stratification for arrhythmia includes echocardiogram showing preserved ejection fraction, schedule him to have Lexiscan to rule out ischemia, he will be scheduled to see our EP team for consideration of more advanced therapy which may include the mexiletine however at this stage I will try small dose of beta-blocker previously he had some issue with this therefore I will try a very small dose of medication only 12.5 mg metoprolol twice daily I warned him about potential side effects of this  and ask him to stop it if he develop those. Coronary disease denies have any signs and symptoms of reactivation of the problem but frequent ventricular ectopy become a problem we will rule out obstruction by doing stress testing. Status post atrial flutter ablation, no more of this arrhythmia. Implantable loop recorder in place but not working anymore battery is depleted.   Medication Adjustments/Labs and Tests Ordered: Current medicines are reviewed at length with the patient today.  Concerns regarding medicines are outlined above.  No orders of the defined types were placed in this encounter.  Medication changes: No orders of the defined types were placed in this encounter.   Signed, Georgeanna Lea, MD, [facility] 01/05/2023 2:16 PM    [facility]

## 2023-01-05 NOTE — Addendum Note (Signed)
Addended by: Baldo Ash D on: 01/05/2023 02:43 PM   Modules accepted: Orders

## 2023-01-05 NOTE — Patient Instructions (Addendum)
Medication Instructions:   START: Metoprolol Tartrate 12.5mg  twice daily   Lab Work: BMP, Magnesium- today If you have labs (blood work) drawn today and your tests are completely normal, you will receive your results only by: MyChart Message (if you have MyChart) OR A paper copy in the mail If you have any lab test that is abnormal or we need to change your treatment, we will call you to review the results.   Testing/Procedures: Your physician has requested that you have a lexiscan myoview. Exercise Stress Test.  For further information please visit [email or web address] Please follow instruction sheet, as given.  The test will take approximately 3 to 4 hours to complete; you may bring reading material.  If someone comes with you to your appointment, they will need to remain in the main lobby due to limited space in the testing area.   How to prepare for your Myocardial Perfusion Test: Do not eat or drink 3 hours prior to your test, except you may have water. Do not consume products containing caffeine (regular or decaffeinated) 12 hours prior to your test. (ex: coffee, chocolate, sodas, tea). Do bring a list of your current medications with you.  If not listed below, you may take your medications as normal. Do wear comfortable clothes (no dresses or overalls) and walking shoes, tennis shoes preferred (No heels or open toe shoes are allowed). Do NOT wear cologne, perfume, aftershave, or lotions (deodorant is allowed). If these instructions are not followed, your test will have to be rescheduled.     Follow-Up: At [facility], you and your health needs are our priority.  As part of our continuing mission to provide you with exceptional heart care, we have created designated Provider Care Teams.  These Care Teams include your primary Cardiologist (physician) and Advanced Practice Providers (APPs -  Physician Assistants and Nurse Practitioners) who all work together to provide you with the  care you need, when you need it.  We recommend signing up for the patient portal called "MyChart".  Sign up information is provided on this After Visit Summary.  MyChart is used to connect with patients for Virtual Visits (Telemedicine).  Patients are able to view lab/test results, encounter notes, upcoming appointments, etc.  Non-urgent messages can be sent to your provider as well.   To learn more about what you can do with MyChart, go to [email or web address].    Your next appointment:   3 month(s)  The format for your next appointment:   In Person  Provider:   Gypsy Balsam, MD    Other Instructions Appt with Dr. Elberta Fortis

## 2023-01-05 NOTE — Addendum Note (Signed)
Addended by: Baldo Ash D on: 01/05/2023 02:30 PM   Modules accepted: Orders

## 2023-01-05 NOTE — Addendum Note (Signed)
Addended by: Baldo Ash D on: 01/05/2023 03:47 PM   Modules accepted: Orders

## 2023-01-06 LAB — BASIC METABOLIC PANEL
BUN/Creatinine Ratio: 22 (ref 10–24)
BUN: 24 mg/dL (ref 8–27)
CO2: 21 mmol/L (ref 20–29)
Calcium: 8.6 mg/dL (ref 8.6–10.2)
Chloride: 107 mmol/L — ABNORMAL HIGH (ref 96–106)
Creatinine, Ser: 1.07 mg/dL (ref 0.76–1.27)
Glucose: 88 mg/dL (ref 70–99)
Potassium: 5.3 mmol/L — ABNORMAL HIGH (ref 3.5–5.2)
Sodium: 142 mmol/L (ref 134–144)
eGFR: 69 mL/min/{1.73_m2} (ref >59)

## 2023-01-06 LAB — MAGNESIUM: Magnesium: 2.1 mg/dL (ref 1.6–2.3)

## 2023-01-11 ENCOUNTER — Ambulatory Visit: Payer: Medicare Other | Attending: Cardiology

## 2023-01-11 DIAGNOSIS — I2510 Atherosclerotic heart disease of native coronary artery without angina pectoris: Secondary | ICD-10-CM

## 2023-01-11 DIAGNOSIS — I472 Ventricular tachycardia, unspecified: Secondary | ICD-10-CM

## 2023-01-11 MED ORDER — TECHNETIUM TC 99M TETROFOSMIN IV KIT
10.4000 | PACK | Freq: Once | INTRAVENOUS | Status: AC | PRN
  Administered 2023-01-11: 10.4 via INTRAVENOUS

## 2023-01-11 MED ORDER — REGADENOSON 0.4 MG/5ML IV SOLN
0.4000 mg | Freq: Once | INTRAVENOUS | Status: AC
  Administered 2023-01-11: 0.4 mg via INTRAVENOUS

## 2023-01-11 MED ORDER — TECHNETIUM TC 99M TETROFOSMIN IV KIT
30.5000 | PACK | Freq: Once | INTRAVENOUS | Status: AC | PRN
  Administered 2023-01-11: 30.5 via INTRAVENOUS

## 2023-01-16 NOTE — Addendum Note (Signed)
Addended by: Gypsy Balsam on: 01/16/2023 09:47 AM   Modules accepted: Orders

## 2023-01-18 LAB — MYOCARDIAL PERFUSION IMAGING
Estimated workload: 10.1
Exercise duration (min): 7 min
Exercise duration (sec): 48 s
LV dias vol: 90 mL (ref 62–150)
LV sys vol: 39 mL
MPHR: 138 {beats}/min
Nuc Stress EF: 57 %
Peak HR: 116 {beats}/min
Percent HR: 8 %
RPE: 20
Rest HR: 69 {beats}/min
Rest Nuclear Isotope Dose: 10.4 mCi
SDS: 1
SRS: 3
SSS: 4
ST Depression (mm): 0 mm
Stress Nuclear Isotope Dose: 30.5 mCi
TID: 0.99

## 2023-01-25 ENCOUNTER — Telehealth: Payer: Self-pay

## 2023-01-25 NOTE — Telephone Encounter (Signed)
Lab Results reviewed with pt as per Dr. Vanetta Shawl note.  Pt verbalized understanding and had no additional questions. Routed to PCP

## 2023-01-25 NOTE — Telephone Encounter (Signed)
Stress Test Results reviewed with pt as per Dr. Wendy Poet note.  Pt verbalized understanding and had no additional questions. Routed to PCP

## 2023-01-25 NOTE — Telephone Encounter (Signed)
Pt has appt with EP 03-06-23

## 2023-01-26 ENCOUNTER — Other Ambulatory Visit: Payer: Self-pay | Admitting: Cardiology

## 2023-01-28 ENCOUNTER — Other Ambulatory Visit: Payer: Self-pay | Admitting: Cardiology

## 2023-03-06 ENCOUNTER — Ambulatory Visit: Payer: Medicare Other | Attending: Cardiology | Admitting: Cardiology

## 2023-03-06 ENCOUNTER — Encounter: Payer: Self-pay | Admitting: Cardiology

## 2023-03-06 VITALS — BP 128/88 | HR 82 | Ht 72.0 in | Wt 191.2 lb

## 2023-03-06 DIAGNOSIS — I493 Ventricular premature depolarization: Secondary | ICD-10-CM

## 2023-03-06 DIAGNOSIS — I10 Essential (primary) hypertension: Secondary | ICD-10-CM | POA: Diagnosis not present

## 2023-03-06 DIAGNOSIS — I2510 Atherosclerotic heart disease of native coronary artery without angina pectoris: Secondary | ICD-10-CM

## 2023-03-06 MED ORDER — MEXILETINE HCL 250 MG PO CAPS: 250.0000 mg | ORAL_CAPSULE | Freq: Two times a day (BID) | ORAL | 3 refills | Status: DC

## 2023-03-06 NOTE — Patient Instructions (Signed)
Medication Instructions:  Your physician has recommended you make the following change in your medication:  START Mexiletine 250 mg twice daily  *If you need a refill on your cardiac medications before your next appointment, please call your pharmacy*   Lab Work: None ordered   Testing/Procedures: None ordered   Follow-Up: At [facility], you and your health needs are our priority.  As part of our continuing mission to provide you with exceptional heart care, we have created designated Provider Care Teams.  These Care Teams include your primary Cardiologist (physician) and Advanced Practice Providers (APPs -  Physician Assistants and Nurse Practitioners) who all work together to provide you with the care you need, when you need it.  Your next appointment:   3 month(s)  The format for your next appointment:   In Person  Provider:   Loman Brooklyn, MD    Thank you for choosing [facility]!!   Dory Horn, RN [phone]  Other Instructions   Mexiletine Capsules What is this medication? MEXILETINE (mex [place] e teen) treats a fast or irregular heartbeat (arrhythmia). It works by slowing down overactive electric signals in the heart, which stabilizes your heart rhythm. It belongs to a group of medications called antiarrhythmics. This medicine may be used for other purposes; ask your health care provider or pharmacist if you have questions. COMMON BRAND NAME(S): Mexitil What should I tell my care team before I take this medication? They need to know if you have any of these conditions: Liver disease Other heart problems Previous heart attack An unusual or allergic reaction to mexiletine, other medications, foods, dyes, or preservatives Pregnant or trying to get pregnant Breast-feeding How should I use this medication? Take this medication by mouth with a glass of water. Follow the directions on the prescription label. It is recommended that you take this medication  with food or an antacid. Take your doses at regular intervals. Do not take your medication more often than directed. Do not stop taking except on the advice of your care team. Talk to your care team about the use of this medication in children. Special care may be needed. Overdosage: If you think you have taken too much of this medicine contact a poison control center or emergency room at once. NOTE: This medicine is only for you. Do not share this medicine with others. What if I miss a dose? If you miss a dose, take it as soon as you can. If it is almost time for your next dose, take only that dose. Do not take double or extra doses. What may interact with this medication? Do not take this medication with any of the following: Dofetilide This medication may also interact with the following: Caffeine Cimetidine Medications for depression, anxiety, or other mental health conditions Medications for irregular heartbeat Phenobarbital Phenytoin Rifampin Theophylline This list may not describe all possible interactions. Give your health care provider a list of all the medicines, herbs, non-prescription drugs, or dietary supplements you use. Also tell them if you smoke, drink alcohol, or use illegal drugs. Some items may interact with your medicine. What should I watch for while using this medication? Your condition will be monitored carefully when you first begin therapy. Often, this medication is first started in a hospital or other monitored health care setting. Once you are on maintenance therapy, visit your care team for regular checks on your progress. Because your condition and use of this medication carry some risk, it is a good idea  to carry an identification card, necklace, or bracelet with details of your condition, medications, and care team. This medication may affect your coordination, reaction time, or judgment. Do not drive or operate machinery until you know how this medication affects  you. Sit up or stand slowly to reduce the risk of dizzy or fainting spells. Drinking alcohol with this medication can increase the risk of these side effects. This medication may cause serious skin reactions. They can happen weeks to months after starting the medication. Contact your care team right away if you notice fevers or flu-like symptoms with a rash. The rash may be red or purple and then turn into blisters or peeling of the skin. You may also notice a red rash with swelling of the face, lips, or lymph nodes in your neck or under your arms. What side effects may I notice from receiving this medication? Side effects that you should report to your care team as soon as possible: Allergic reactions--skin rash, itching, hives, swelling of the face, lips, tongue, or throat Heart rhythm changes--fast or irregular heartbeat, dizziness, feeling faint or lightheaded, chest pain, trouble breathing Infection--fever, chills, cough, or sore throat Liver injury--right upper belly pain, loss of appetite, nausea, light-colored stool, dark yellow or brown urine, yellowing skin or eyes, unusual weakness or fatigue Rash, fever, and swollen lymph nodes Seizures Unusual bruising or bleeding Side effects that usually do not require medical attention (report to your care team if they continue or are bothersome): Anxiety, nervousness Blurry vision Dizziness Headache Heartburn Nausea Tremors or shaking Vomiting This list may not describe all possible side effects. Call your doctor for medical advice about side effects. You may report side effects to FDA at 1-800-FDA-1088. Where should I keep my medication? Keep out of reach of children. Store at room temperature between 15 and 30 degrees C (59 and 86 degrees F). Throw away any unused medication after the expiration date. NOTE: This sheet is a summary. It may not cover all possible information. If you have questions about this medicine, talk to your doctor,  pharmacist, or health care provider.  2024 Elsevier/Gold Standard (2022-07-28 00:00:00)

## 2023-03-06 NOTE — Addendum Note (Signed)
Addended by: Baird Lyons on: 03/06/2023 11:15 AM   Modules accepted: Orders

## 2023-03-06 NOTE — Progress Notes (Signed)
  Electrophysiology Office Note:   Date:  03/06/2023  ID:  Scott Whitehead, DOB December 09, 1940, MRN [id number]  Primary Cardiologist: Gypsy Balsam, MD Primary Heart Failure: None Electrophysiologist: Yuette Putnam Jorja Loa, MD      History of Present Illness:   Scott Whitehead is a 83 y.o. male with h/o coronary artery disease post LAD stent, atrial flutter post ablation, hyperlipidemia, PVCs seen today for  for Electrophysiology evaluation of PVCs at the request of Gypsy Balsam.    He wore a cardiac monitor that showed a PVC burden of 13.9%.  He has episodes of bigeminy which makes him feel quite lousy.  He gets short of breath and fatigued when he does any activity during episodes of bigeminy.  He was initially on metoprolol, but this has been reduced.  He had some chest discomfort after it was reduced, but is felt better more recently.  His level of fatigue has also improved.  Review of systems complete and found to be negative unless listed in HPI.   EP Information / Studies Reviewed:    EKG is ordered today. Personal review as below.  EKG Interpretation Date/Time:  Monday March 06 2023 10:40:22 EST Ventricular Rate:  82 PR Interval:  166 QRS Duration:  102 QT Interval:  376 QTC Calculation: 439 R Axis:   -32  Text Interpretation: Sinus rhythm with frequent Premature ventricular complexes Left axis deviation Nonspecific T wave abnormality Abnormal ECG Since previous tracing Premature ventricular complexes Confirmed by [employer], Kenna Seward ([id number]) on 03/06/2023 11:02:41 AM    Risk Assessment/Calculations:             Physical Exam:   VS:  BP 128/88   Pulse 82   Ht 6' (1.829 m)   Wt 191 lb 3.2 oz (86.7 kg)   SpO2 95%   BMI 25.93 kg/m    Wt Readings from Last 3 Encounters:  03/06/23 191 lb 3.2 oz (86.7 kg)  01/11/23 193 lb (87.5 kg)  01/05/23 193 lb 3.2 oz (87.6 kg)     GEN: Well nourished, well developed in no acute distress NECK: No JVD; No carotid bruits CARDIAC:  Regular rate and rhythm with occasional ectopy, no murmurs, rubs, gallops RESPIRATORY:  Clear to auscultation without rales, wheezing or rhonchi  ABDOMEN: Soft, non-tender, non-distended EXTREMITIES:  No edema; No deformity   ASSESSMENT AND PLAN:    1.  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  He previously had significant fatigue and shortness of breath, but he is feeling improved currently.  Despite this, he would prefer PVC suppression.  Due to his coronary artery disease, medications are limited.  Braelynne Garinger start mexiletine 250 mg twice daily.  2.  Coronary artery disease: No current chest pain.  Plan per primary cardiology.  3.  Atrial flutter: Post ablation.  No further episodes.  Follow up with Dr. Elberta Fortis in 3 months  Signed, Maddox Hlavaty Jorja Loa, MD

## 2023-03-17 ENCOUNTER — Other Ambulatory Visit: Payer: Self-pay | Admitting: Cardiology

## 2023-04-01 ENCOUNTER — Other Ambulatory Visit: Payer: Self-pay | Admitting: Cardiology

## 2023-04-03 ENCOUNTER — Other Ambulatory Visit: Payer: Self-pay

## 2023-04-07 ENCOUNTER — Encounter: Payer: Self-pay | Admitting: Cardiology

## 2023-04-07 ENCOUNTER — Ambulatory Visit: Payer: Medicare Other | Attending: Cardiology | Admitting: Cardiology

## 2023-04-07 VITALS — BP 126/78 | HR 62 | Ht 72.0 in | Wt 194.6 lb

## 2023-04-07 DIAGNOSIS — E782 Mixed hyperlipidemia: Secondary | ICD-10-CM | POA: Diagnosis not present

## 2023-04-07 DIAGNOSIS — Z9861 Coronary angioplasty status: Secondary | ICD-10-CM

## 2023-04-07 DIAGNOSIS — I2510 Atherosclerotic heart disease of native coronary artery without angina pectoris: Secondary | ICD-10-CM

## 2023-04-07 DIAGNOSIS — R0609 Other forms of dyspnea: Secondary | ICD-10-CM | POA: Diagnosis not present

## 2023-04-07 DIAGNOSIS — I10 Essential (primary) hypertension: Secondary | ICD-10-CM | POA: Diagnosis not present

## 2023-04-07 MED ORDER — METOPROLOL TARTRATE 25 MG PO TABS: 12.5000 mg | ORAL_TABLET | Freq: Two times a day (BID) | ORAL | 3 refills | Status: AC

## 2023-04-07 NOTE — Progress Notes (Signed)
 Cardiology Office Note:    Date:  04/07/2023   ID:  Scott Whitehead, DOB Feb 22, 1940, MRN [id number]  PCP:  Street, Stephanie Coup, MD  Cardiologist:  Gypsy Balsam, MD    Referring MD: Street, Stephanie Coup, *   Chief Complaint  Patient presents with   Medication Refill    History of Present Illness:    Scott Whitehead is a 83 y.o. male past medical history significant for coronary artery disease he did have PTCA and stenting of LAD done many years ago, also atrial flutter ablation done by Dr. Jeanne Ivan he does have implantable loop recorder however batteries were depleted he does not want to remove it.  He came to [place] because of episode of tiredness and fatigue.  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  Was referred to our EP team.  He also has some periods of bigeminy which make him feel very bad.  He was put on mexiletine and feeling much better.  He said he gets very rare episode of dizziness overall doing very well still working hard cutting wood with no major difficulties  Past Medical History:  Diagnosis Date   Angina pectoris (HCC) 09/09/2016   CAD S/P percutaneous coronary angioplasty    Stent to LAD many years ago   Cancer [facility])    Coronary artery disease involving native coronary artery 04/02/2015   Cardiac catheterization in the middle of 2018 showing nonobstructive lesion PET patent stent site   Dizziness 11/09/2016   Dyspnea on exertion 12/24/2018   Essential hypertension 04/28/2016   GERD (gastroesophageal reflux disease)    High risk medication use 06/05/2015   Hyperlipidemia    Hyperlipidemia with target low density lipoprotein (LDL) cholesterol less than 70 mg/dL 05/23/8117   Hypertension    Primary central sleep apnea 04/02/2015   Status post ablation of atrial flutter 04/02/2015   Status post placement of implantable loop recorder 06/02/2017   Battery already depleted patient does not want to have it removed   Syncope 04/02/2015   VT (ventricular  tachycardia) (HCC) 04/02/2015    Past Surgical History:  Procedure Laterality Date   CARDIAC SURGERY     heart monitor is placed in the heart area   ENDOVENOUS ABLATION SAPHENOUS VEIN W/ LASER Right 03/08/2018   endovenous laser ablation right greater saphenous vein by Waverly Ferrari MD    LEFT HEART CATH AND CORONARY ANGIOGRAPHY N/A 09/13/2016   Procedure: Left Heart Cath and Coronary Angiography;  Surgeon: Marykay Lex, MD;  Location: [facility] INVASIVE CV LAB;  Service: Cardiovascular;  Laterality: N/A;   LOOP RECORDER IMPLANT     stab phlebectomy  Right 08/23/2018   stab phlebectomy 10-20 incisions right leg by Chritopher Edilia Bo MD     Current Medications: Current Meds  Medication Sig   aspirin EC 81 MG tablet Take 81 mg by mouth daily.   diphenhydrAMINE (BENADRYL) 25 MG tablet Take 25 mg by mouth every 6 (six) hours as needed for sleep.   ezetimibe (ZETIA) 10 MG tablet TAKE 1 TABLET BY MOUTH DAILY   finasteride (PROSCAR) 5 MG tablet Take 1 tablet by mouth daily.   ibuprofen (ADVIL) 400 MG tablet Take 400 mg by mouth daily.   lisinopril (ZESTRIL) 2.5 MG tablet Take 2.5 mg by mouth daily.   MAGNESIUM PO Take 1 tablet by mouth daily. Unknown strength   Melatonin 10 MG TABS Take 1 tablet by mouth at bedtime as needed (sleep).   metoprolol tartrate (LOPRESSOR)  25 MG tablet Take 0.5 tablets (12.5 mg total) by mouth 2 (two) times daily.   mexiletine (MEXITIL) 250 MG capsule Take 1 capsule (250 mg total) by mouth 2 (two) times daily.   Multiple Vitamin (MULTIVITAMIN WITH MINERALS) TABS tablet Take 1 tablet by mouth daily. Unknown strength   MYRBETRIQ 50 MG TB24 tablet Take 50 mg by mouth daily.   nitroGLYCERIN (NITROSTAT) 0.4 MG SL tablet Place 1 tablet (0.4 mg total) under the tongue every 5 (five) minutes as needed for chest pain.   Omega-3 Fatty Acids (FISH OIL) 1000 MG CAPS Take 1,000 mg by mouth 2 (two) times daily.    ranolazine (RANEXA) 500 MG 12 hr tablet TAKE 1 TABLET BY MOUTH  TWICE  DAILY   rosuvastatin (CRESTOR) 40 MG tablet TAKE 1 TABLET BY MOUTH DAILY   tamsulosin (FLOMAX) 0.4 MG CAPS capsule Take 0.4 mg by mouth daily.     Allergies:   Patient has no known allergies.   Social History   Socioeconomic History   Marital status: Married    Spouse name: Not on file   Number of children: Not on file   Years of education: Not on file   Highest education level: Not on file  Occupational History   Not on file  Tobacco Use   Smoking status: Never   Smokeless tobacco: Never  Vaping Use   Vaping status: Never Used  Substance and Sexual Activity   Alcohol use: No   Drug use: No   Sexual activity: Not on file  Other Topics Concern   Not on file  Social History Narrative   Not on file   Social Drivers of Health   Financial Resource Strain: Not on file  Food Insecurity: Not on file  Transportation Needs: Not on file  Physical Activity: Not on file  Stress: No Stress Concern Present (07/28/2020)   Received from [facility], [facility]   [employer] [facility] - Occupational Stress Questionnaire    Feeling of Stress : Not at all  Social Connections: Unknown (06/29/2021)   Received from [facility], [facility]   Social Network    Social Network: Not on file     Family History: The patient's family history includes Alzheimer's disease in his father; Blindness in his father; Glaucoma in his father; Heart disease in his mother; Hypertension in his father and mother. ROS:   Please see the history of present illness.    All 14 point review of systems negative except as described per history of present illness  EKGs/Labs/Other Studies Reviewed:         Recent Labs: 11/24/2022: ALT 13; Hemoglobin 12.9; Platelets 180; TSH 2.260 01/05/2023: BUN 24; Creatinine, Ser 1.07; Magnesium 2.1; Potassium 5.3; Sodium 142  Recent Lipid Panel    Component Value Date/Time   CHOL 104 12/31/2020 1055   TRIG 83 12/31/2020 1055   HDL 46  12/31/2020 1055   CHOLHDL 2.3 12/31/2020 1055   LDLCALC 41 12/31/2020 1055    Physical Exam:    VS:  BP 126/78 (BP Location: Right Arm, Patient Position: Sitting)   Pulse 62   Ht 6' (1.829 m)   Wt 194 lb 9.6 oz (88.3 kg)   SpO2 94%   BMI 26.39 kg/m     Wt Readings from Last 3 Encounters:  04/07/23 194 lb 9.6 oz (88.3 kg)  03/06/23 191 lb 3.2 oz (86.7 kg)  01/11/23 193 lb (87.5 kg)     GEN:  Well  nourished, well developed in no acute distress HEENT: Normal NECK: No JVD; No carotid bruits LYMPHATICS: No lymphadenopathy CARDIAC: RRR, no murmurs, no rubs, no gallops RESPIRATORY:  Clear to auscultation without rales, wheezing or rhonchi  ABDOMEN: Soft, non-tender, non-distended MUSCULOSKELETAL:  No edema; No deformity  SKIN: Warm and dry LOWER EXTREMITIES: no swelling NEUROLOGIC:  Alert and oriented x 3 PSYCHIATRIC:  Normal affect   ASSESSMENT:    1. CAD S/P percutaneous coronary angioplasty   2. Essential hypertension   3. Mixed hyperlipidemia    PLAN:    In order of problems listed above:  Coronary disease stable from that point reviewed appropriate guideline directed medical therapy which I will continue. Frequent ventricular ectopy successfully suppressed with mexiletine which I will continue. History of dyslipidemia I did review K PN which show me total cholesterol 108 HDL 42 good cholesterol control continue present management. I will ask him to have repeat echocardiogram done in the summer I will make sure he does not develop PVCs induced cardiomyopathy.  Likely overall he feels good   Medication Adjustments/Labs and Tests Ordered: Current medicines are reviewed at length with the patient today.  Concerns regarding medicines are outlined above.  No orders of the defined types were placed in this encounter.  Medication changes: No orders of the defined types were placed in this encounter.   Signed, Georgeanna Lea, MD, [facility] 04/07/2023 1:19 PM    [facility]

## 2023-04-07 NOTE — Patient Instructions (Signed)
 Medication Instructions:  Your physician recommends that you continue on your current medications as directed. Please refer to the Current Medication list given to you today.  *If you need a refill on your cardiac medications before your next appointment, please call your pharmacy*   Lab Work: None Ordered If you have labs (blood work) drawn today and your tests are completely normal, you will receive your results only by: MyChart Message (if you have MyChart) OR A paper copy in the mail If you have any lab test that is abnormal or we need to change your treatment, we will call you to review the results.   Testing/Procedures: JULY Your physician has requested that you have an echocardiogram. Echocardiography is a painless test that uses sound waves to create images of your heart. It provides your doctor with information about the size and shape of your heart and how well your heart's chambers and valves are working. This procedure takes approximately one hour. There are no restrictions for this procedure. Please do NOT wear cologne, perfume, aftershave, or lotions (deodorant is allowed). Please arrive 15 minutes prior to your appointment time.  Please note: We ask at that you not bring children with you during ultrasound (echo/ vascular) testing. Due to room size and safety concerns, children are not allowed in the ultrasound rooms during exams. Our front office staff cannot provide observation of children in our lobby area while testing is being conducted. An adult accompanying a patient to their appointment will only be allowed in the ultrasound room at the discretion of the ultrasound technician under special circumstances. We apologize for any inconvenience.    Follow-Up: At [facility], you and your health needs are our priority.  As part of our continuing mission to provide you with exceptional heart care, we have created designated Provider Care Teams.  These Care Teams include your  primary Cardiologist (physician) and Advanced Practice Providers (APPs -  Physician Assistants and Nurse Practitioners) who all work together to provide you with the care you need, when you need it.  We recommend signing up for the patient portal called "MyChart".  Sign up information is provided on this After Visit Summary.  MyChart is used to connect with patients for Virtual Visits (Telemedicine).  Patients are able to view lab/test results, encounter notes, upcoming appointments, etc.  Non-urgent messages can be sent to your provider as well.   To learn more about what you can do with MyChart, go to [email or web address].    Your next appointment:   6 month(s)  The format for your next appointment:   In Person  Provider:   Gypsy Balsam, MD    Other Instructions NA

## 2023-04-07 NOTE — Addendum Note (Signed)
 Addended by: Baldo Ash D on: 04/07/2023 01:28 PM   Modules accepted: Orders

## 2023-05-03 ENCOUNTER — Other Ambulatory Visit: Payer: Medicare Other

## 2023-05-15 DIAGNOSIS — Z0000 Encounter for general adult medical examination without abnormal findings: Secondary | ICD-10-CM | POA: Diagnosis not present

## 2023-05-15 DIAGNOSIS — Z79899 Other long term (current) drug therapy: Secondary | ICD-10-CM | POA: Diagnosis not present

## 2023-05-15 DIAGNOSIS — I25119 Atherosclerotic heart disease of native coronary artery with unspecified angina pectoris: Secondary | ICD-10-CM | POA: Diagnosis not present

## 2023-05-15 DIAGNOSIS — N1831 Chronic kidney disease, stage 3a: Secondary | ICD-10-CM | POA: Diagnosis not present

## 2023-05-15 DIAGNOSIS — I4892 Unspecified atrial flutter: Secondary | ICD-10-CM | POA: Diagnosis not present

## 2023-05-15 DIAGNOSIS — K2960 Other gastritis without bleeding: Secondary | ICD-10-CM | POA: Diagnosis not present

## 2023-05-15 DIAGNOSIS — E782 Mixed hyperlipidemia: Secondary | ICD-10-CM | POA: Diagnosis not present

## 2023-05-15 DIAGNOSIS — Z131 Encounter for screening for diabetes mellitus: Secondary | ICD-10-CM | POA: Diagnosis not present

## 2023-05-15 DIAGNOSIS — I472 Ventricular tachycardia, unspecified: Secondary | ICD-10-CM | POA: Diagnosis not present

## 2023-05-15 DIAGNOSIS — N3281 Overactive bladder: Secondary | ICD-10-CM | POA: Diagnosis not present

## 2023-05-15 DIAGNOSIS — D6869 Other thrombophilia: Secondary | ICD-10-CM | POA: Diagnosis not present

## 2023-05-15 DIAGNOSIS — I10 Essential (primary) hypertension: Secondary | ICD-10-CM | POA: Diagnosis not present

## 2023-06-04 NOTE — Progress Notes (Signed)
  Electrophysiology Office Note:   Date:  06/05/2023  ID:  Scott Whitehead, DOB 10-25-1940, MRN [id number]  Primary Cardiologist: Scott Burger, MD Primary Heart Failure: None Electrophysiologist: Scott Yearwood Cortland Ding, MD      History of Present Illness:   Scott Whitehead is a 83 y.o. male with h/o coronary artery disease post LAD stent, atrial flutter post ablation, hyperlipidemia, PVCs seen today for routine electrophysiology followup.   Since last being seen in our clinic the patient reports some improvements since being on mexiletine.  He initially felt quite well on the medication, but he has had recurrence of his dizziness.  He gets dizzy when he stands up too quickly as well as climbing up stairs.  He feels better after 10 to 15 seconds.  He is unaware of PVCs.  he denies chest pain, palpitations, dyspnea, PND, orthopnea, nausea, vomiting, dizziness, syncope, edema, weight gain, or early satiety.   Review of systems complete and found to be negative unless listed in HPI.   EP Information / Studies Reviewed:    EKG is ordered today. Personal review as below.  EKG Interpretation Date/Time:  Monday June 05 2023 08:35:51 EDT Ventricular Rate:  72 PR Interval:  174 QRS Duration:  114 QT Interval:  400 QTC Calculation: 438 R Axis:   7  Text Interpretation: Sinus rhythm with occasional Premature ventricular complexes Septal infarct , age undetermined Abnormal ECG When compared with ECG of 06-Mar-2023 10:40, No significant change since last tracing Confirmed by Scott Whitehead ([id number]) on 06/05/2023 8:41:50 AM     Risk Assessment/Calculations:             Physical Exam:   VS:  BP 108/64   Pulse 72   Ht 6' (1.829 m)   Wt 189 lb 9.6 oz (86 kg)   SpO2 97%   BMI 25.71 kg/m    Wt Readings from Last 3 Encounters:  06/05/23 189 lb 9.6 oz (86 kg)  04/07/23 194 lb 9.6 oz (88.3 kg)  03/06/23 191 lb 3.2 oz (86.7 kg)     GEN: Well nourished, well developed in no acute  distress NECK: No JVD; No carotid bruits CARDIAC: Regular rate and rhythm with occasional ectopy, no murmurs, rubs, gallops RESPIRATORY:  Clear to auscultation without rales, wheezing or rhonchi  ABDOMEN: Soft, non-tender, non-distended EXTREMITIES:  No edema; No deformity   ASSESSMENT AND PLAN:    1.  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  Currently on mexiletine 250 mg twice daily.  He is continuing to have PVCs.  Unsure of his overall burden.  I have offered a monitor, but he would like to hold off on this.  He does have some dizziness when he stands, as well as climbs stairs.  Scott Whitehead stop his lisinopril today.  2.  Coronary artery disease: No current chest pain.  Plan per primary cardiology  3.  Typical atrial flutter: Post ablation.  No further episodes  Follow up with Dr. Lawana Whitehead in 6 months  Signed, Scott Simson Cortland Ding, MD

## 2023-06-05 ENCOUNTER — Ambulatory Visit: Payer: Medicare Other | Attending: Cardiology | Admitting: Cardiology

## 2023-06-05 ENCOUNTER — Encounter: Payer: Self-pay | Admitting: Cardiology

## 2023-06-05 VITALS — BP 108/64 | HR 72 | Ht 72.0 in | Wt 189.6 lb

## 2023-06-05 DIAGNOSIS — I483 Typical atrial flutter: Secondary | ICD-10-CM | POA: Diagnosis not present

## 2023-06-05 DIAGNOSIS — I2510 Atherosclerotic heart disease of native coronary artery without angina pectoris: Secondary | ICD-10-CM | POA: Diagnosis not present

## 2023-06-05 DIAGNOSIS — I493 Ventricular premature depolarization: Secondary | ICD-10-CM | POA: Diagnosis not present

## 2023-06-05 MED ORDER — MEXILETINE HCL 250 MG PO CAPS: 250.0000 mg | ORAL_CAPSULE | Freq: Two times a day (BID) | ORAL | 2 refills | Status: DC

## 2023-06-05 NOTE — Patient Instructions (Addendum)
 Medication Instructions:  Your physician has recommended you make the following change in your medication:  STOP Lisinopril  *If you need a refill on your cardiac medications before your next appointment, please call your pharmacy*  Lab Work: None ordered  Testing/Procedures: None ordered  Follow-Up: At [facility], you and your health needs are our priority.  As part of our continuing mission to provide you with exceptional heart care, our providers are all part of one team.  This team includes your primary Cardiologist (physician) and Advanced Practice Providers or APPs (Physician Assistants and Nurse Practitioners) who all work together to provide you with the care you need, when you need it.  Your next appointment:   6 month(s)  Provider:   Agatha Horsfall, MD    We recommend signing up for the patient portal called "MyChart".  Sign up information is provided on this After Visit Summary.  MyChart is used to connect with patients for Virtual Visits (Telemedicine).  Patients are able to view lab/test results, encounter notes, upcoming appointments, etc.  Non-urgent messages can be sent to your provider as well.   To learn more about what you can do with MyChart, go to [email or web address].    Thank you for choosing [facility]!!   Reece Cane, RN [phone]

## 2023-06-19 ENCOUNTER — Other Ambulatory Visit: Payer: Self-pay | Admitting: Cardiology

## 2023-07-07 DIAGNOSIS — S46812A Strain of other muscles, fascia and tendons at shoulder and upper arm level, left arm, initial encounter: Secondary | ICD-10-CM | POA: Diagnosis not present

## 2023-07-07 DIAGNOSIS — S161XXA Strain of muscle, fascia and tendon at neck level, initial encounter: Secondary | ICD-10-CM | POA: Diagnosis not present

## 2023-07-19 ENCOUNTER — Other Ambulatory Visit: Payer: Self-pay | Admitting: Cardiology

## 2023-08-06 ENCOUNTER — Other Ambulatory Visit: Payer: Self-pay | Admitting: Cardiology

## 2023-08-16 ENCOUNTER — Ambulatory Visit: Payer: Medicare Other | Attending: Cardiology

## 2023-08-16 DIAGNOSIS — R0609 Other forms of dyspnea: Secondary | ICD-10-CM

## 2023-08-17 LAB — ECHOCARDIOGRAM COMPLETE
AR max vel: 2.42 cm2
AV Area VTI: 2.67 cm2
AV Area mean vel: 2.36 cm2
AV Mean grad: 4.5 mmHg
AV Peak grad: 9.2 mmHg
Ao pk vel: 1.52 m/s
Area-P 1/2: 2.73 cm2
S' Lateral: 3.2 cm

## 2023-08-21 ENCOUNTER — Ambulatory Visit: Payer: Self-pay | Admitting: Cardiology

## 2023-09-17 ENCOUNTER — Other Ambulatory Visit: Payer: Self-pay | Admitting: Cardiology

## 2023-09-25 ENCOUNTER — Telehealth: Payer: Self-pay

## 2023-09-25 DIAGNOSIS — R0609 Other forms of dyspnea: Secondary | ICD-10-CM

## 2023-09-25 DIAGNOSIS — I10 Essential (primary) hypertension: Secondary | ICD-10-CM

## 2023-09-25 NOTE — Telephone Encounter (Signed)
 Echo Results reviewed with pt as per Dr. Karry note.  Pt verbalized understanding and had no additional questions. ProBNP, Chem 7 ordered per Dr. Karry note. Plan to start Losartan if labs ok Routed to PCP

## 2023-10-10 DIAGNOSIS — I10 Essential (primary) hypertension: Secondary | ICD-10-CM | POA: Diagnosis not present

## 2023-10-10 DIAGNOSIS — R0609 Other forms of dyspnea: Secondary | ICD-10-CM | POA: Diagnosis not present

## 2023-10-11 ENCOUNTER — Ambulatory Visit: Payer: Self-pay | Admitting: Cardiology

## 2023-10-11 LAB — BASIC METABOLIC PANEL WITH GFR
BUN/Creatinine Ratio: 18 (ref 10–24)
BUN: 20 mg/dL (ref 8–27)
CO2: 24 mmol/L (ref 20–29)
Calcium: 9.2 mg/dL (ref 8.6–10.2)
Chloride: 105 mmol/L (ref 96–106)
Creatinine, Ser: 1.12 mg/dL (ref 0.76–1.27)
Glucose: 109 mg/dL — ABNORMAL HIGH (ref 70–99)
Potassium: 4.3 mmol/L (ref 3.5–5.2)
Sodium: 142 mmol/L (ref 134–144)
eGFR: 66 mL/min/1.73 (ref >59)

## 2023-10-11 LAB — PRO B NATRIURETIC PEPTIDE: NT-Pro BNP: 326 pg/mL (ref 0–486)

## 2023-10-18 ENCOUNTER — Telehealth: Payer: Self-pay

## 2023-10-18 NOTE — Telephone Encounter (Signed)
 Left message on My Chart with lab results per Dr. Karry note. Routed to PCP

## 2023-10-21 ENCOUNTER — Other Ambulatory Visit: Payer: Self-pay | Admitting: Cardiology

## 2023-10-24 ENCOUNTER — Ambulatory Visit: Attending: Cardiology | Admitting: Cardiology

## 2023-10-24 ENCOUNTER — Encounter: Payer: Self-pay | Admitting: Cardiology

## 2023-10-24 VITALS — BP 122/72 | HR 76 | Ht 72.0 in | Wt 183.6 lb

## 2023-10-24 DIAGNOSIS — I2510 Atherosclerotic heart disease of native coronary artery without angina pectoris: Secondary | ICD-10-CM

## 2023-10-24 DIAGNOSIS — E782 Mixed hyperlipidemia: Secondary | ICD-10-CM

## 2023-10-24 DIAGNOSIS — Z9861 Coronary angioplasty status: Secondary | ICD-10-CM | POA: Diagnosis not present

## 2023-10-24 DIAGNOSIS — I10 Essential (primary) hypertension: Secondary | ICD-10-CM | POA: Diagnosis not present

## 2023-10-24 DIAGNOSIS — I420 Dilated cardiomyopathy: Secondary | ICD-10-CM | POA: Insufficient documentation

## 2023-10-24 MED ORDER — SACUBITRIL-VALSARTAN 24-26 MG PO TABS: 1.0000 | ORAL_TABLET | Freq: Two times a day (BID) | ORAL | 3 refills | Status: DC

## 2023-10-24 NOTE — Patient Instructions (Signed)
 Medication Instructions:   STOP: Lisinopril x 2 days  START: Entresto  24-26mg  1 tablet twice daily     Lab Work: Your physician recommends that you return for lab work in: 1 week after starting Entresto  You need to have labs done when you are fasting.  You can come Monday through Friday 8:30 am to 12:00 pm and 1:15 to 4:30. You do not need to make an appointment as the order has already been placed. The labs you are going to have done are BMET   Testing/Procedures: None Ordered   Follow-Up: At [facility], you and your health needs are our priority.  As part of our continuing mission to provide you with exceptional heart care, we have created designated Provider Care Teams.  These Care Teams include your primary Cardiologist (physician) and Advanced Practice Providers (APPs -  Physician Assistants and Nurse Practitioners) who all work together to provide you with the care you need, when you need it.  We recommend signing up for the patient portal called MyChart.  Sign up information is provided on this After Visit Summary.  MyChart is used to connect with patients for Virtual Visits (Telemedicine).  Patients are able to view lab/test results, encounter notes, upcoming appointments, etc.  Non-urgent messages can be sent to your provider as well.   To learn more about what you can do with MyChart, go to [email or web address].    Your next appointment:   4 month(s)  The format for your next appointment:   In Person  Provider:   Lamar Fitch, MD    Other Instructions NA

## 2023-10-24 NOTE — Progress Notes (Unsigned)
 Cardiology Office Note:    Date:  10/24/2023   ID:  Scott Whitehead, DOB 02-23-1940, MRN [id number]  PCP:  Street, Lonni HERO, MD  Cardiologist:  Lamar Fitch, MD    Referring MD: [address] Lonni HERO, *   No chief complaint on file.   History of Present Illness:    Scott Whitehead is a 83 y.o. male  past medical history significant for coronary artery disease he did have PTCA and stenting of LAD done many years ago, also atrial flutter ablation done by Dr. Anderson he does have implantable loop recorder however batteries were depleted he does not want to remove it. He came to us  because of episode of tiredness and fatigue. 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  Recently echocardiogram has been repeated which showed diminished left ventricle ejection fraction likely only mildly.  He is very busy he is taking care of his wife who recently left having stroke denies have any chest pain tightness squeezing pressure mid chest, he is not sure if his lisinopril has been discontinued on no palpitations  Past Medical History:  Diagnosis Date   Angina pectoris (HCC) 09/09/2016   CAD S/P percutaneous coronary angioplasty    Stent to LAD many years ago   [facility]cer 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   Status post ablation of atrial flutter 04/02/2015   Status post placement of implantable loop recorder 06/02/2017   Battery already depleted patient does not want to have it removed   Syncope  04/02/2015   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   VT (ventricular tachycardia) (HCC) 04/02/2015    Past Surgical History:  Procedure Laterality Date   CARDIAC SURGERY     heart monitor is placed in the heart area   ENDOVENOUS ABLATION SAPHENOUS VEIN W/ LASER Right 03/08/2018   endovenous laser ablation right greater saphenous vein by Lonni Blade MD    LEFT HEART CATH AND CORONARY ANGIOGRAPHY N/A 09/13/2016   Procedure: Left Heart Cath and Coronary Angiography;  Surgeon: Anner Alm ORN, MD;  Location: [facility] INVASIVE CV LAB;  Service: Cardiovascular;  Laterality: N/A;   LOOP RECORDER IMPLANT     stab phlebectomy  Right 08/23/2018   stab phlebectomy 10-20 incisions right leg by Chritopher Blade MD     Current Medications: Current Meds  Medication Sig   aspirin  EC 81 MG tablet Take 81 mg by mouth daily.   diphenhydrAMINE (BENADRYL) 25 MG tablet Take 25 mg by mouth every 6 (six) hours as needed for sleep.   ezetimibe  (ZETIA ) 10 MG tablet TAKE 1 TABLET BY MOUTH DAILY   finasteride (PROSCAR) 5 MG tablet Take 1 tablet by mouth daily.   ibuprofen (ADVIL) 400 MG tablet Take 400 mg by mouth daily.   lisinopril (ZESTRIL) 2.5 MG tablet Take 2.5 mg by mouth daily.   MAGNESIUM PO Take 1 tablet by mouth daily. Unknown strength   Melatonin 10 MG TABS Take 1 tablet by mouth at bedtime as needed (sleep).   metoprolol  tartrate (LOPRESSOR ) 25  MG tablet Take 0.5 tablets (12.5 mg total) by mouth 2 (two) times daily.   mexiletine (MEXITIL) 250 MG capsule Take 1 capsule (250 mg total) by mouth 2 (two) times daily.   Multiple Vitamin (MULTIVITAMIN WITH MINERALS) TABS tablet Take 1 tablet by mouth daily. Unknown strength   MYRBETRIQ 50 MG TB24 tablet Take 50 mg by mouth daily.   nitroGLYCERIN  (NITROSTAT ) 0.4 MG SL tablet Place 1 tablet (0.4 mg total) under the tongue every 5 (five) minutes as needed for chest pain.   Omega-3 Fatty Acids (FISH OIL) 1000 MG CAPS Take 1,000 mg by mouth 2 (two) times daily.     ranolazine  (RANEXA ) 500 MG 12 hr tablet TAKE 1 TABLET BY MOUTH TWICE  DAILY   rosuvastatin  (CRESTOR ) 40 MG tablet TAKE 1 TABLET BY MOUTH DAILY   tamsulosin (FLOMAX) 0.4 MG CAPS capsule Take 0.4 mg by mouth daily.     Allergies:   Patient has no known allergies.   Social History   Socioeconomic History   Marital status: Married    Spouse name: Not on file   Number of children: Not on file   Years of education: Not on file   Highest education level: Not on file  Occupational History   Not on file  Tobacco Use   Smoking status: Never   Smokeless tobacco: Never  Vaping Use   Vaping status: Never Used  Substance and Sexual Activity   Alcohol use: No   Drug use: No   Sexual activity: Not on file  Other Topics Concern   Not on file  Social History Narrative   Not on file   Social Drivers of Health   Financial Resource Strain: Not on file  Food Insecurity: Not on file  Transportation Needs: Not on file  Physical Activity: Not on file  Stress: No Stress Concern Present (07/28/2020)   Received from [facility] of Occupational Health - Occupational Stress Questionnaire    Feeling of Stress : Not at all  Social Connections: Unknown (06/29/2021)   Received from [employer]   Social Network    Social Network: Not on file     Family History: The patient's family history includes Alzheimer's disease in his father; Blindness in his father; Glaucoma in his father; Heart disease in his mother; Hypertension in his father and mother. ROS:   Please see the history of present illness.    All 14 point review of systems negative except as described per history of present illness  EKGs/Labs/Other Studies Reviewed:         Recent Labs: 11/24/2022: ALT 13; Hemoglobin 12.9; Platelets 180; TSH 2.260 01/05/2023: Magnesium 2.1 10/10/2023: BUN 20; Creatinine, Ser 1.12; NT-Pro BNP 326; Potassium 4.3; Sodium 142  Recent Lipid Panel    Component Value Date/Time    CHOL 104 12/31/2020 1055   TRIG 83 12/31/2020 1055   HDL 46 12/31/2020 1055   CHOLHDL 2.3 12/31/2020 1055   LDLCALC 41 12/31/2020 1055    Physical Exam:    VS:  BP 122/72   Pulse 76   Ht 6' (1.829 m)   Wt 183 lb 9.6 oz (83.3 kg)   SpO2 97%   BMI 24.90 kg/m     Wt Readings from Last 3 Encounters:  10/24/23 183 lb 9.6 oz (83.3 kg)  06/05/23 189 lb 9.6 oz (86 kg)  04/07/23 194 lb 9.6 oz (88.3 kg)     GEN:  Well nourished, well developed in no acute  distress HEENT: Normal NECK: No JVD; No carotid bruits LYMPHATICS: No lymphadenopathy CARDIAC: RRR, no murmurs, no rubs, no gallops RESPIRATORY:  Clear to auscultation without rales, wheezing or rhonchi  ABDOMEN: Soft, non-tender, non-distended MUSCULOSKELETAL:  No edema; No deformity  SKIN: Warm and dry LOWER EXTREMITIES: no swelling NEUROLOGIC:  Alert and oriented x 3 PSYCHIATRIC:  Normal affect   ASSESSMENT:    1. CAD S/P percutaneous coronary angioplasty   2. Essential hypertension   3. 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 Mixed hyperlipidemia    PLAN:    In order of problems listed above:  Cardiomyopathy diminished ejection fraction 45% could be PVCs induced.  Will try to put him on guideline directed medical therapy.  I will put him on Entresto  24-26 twice daily, Chem-7 will be done next week.  Doyal will follow-up another echocardiogram is still diminished we will do ischemia workup even though he Absolutely no signs and symptoms indicating of reactivation of this problem.  Follow-up by EP. Essential hypertension blood pressure well-controlled continue present management. Coronary disease no chest pain no symptoms indicating problem. Dyslipidemia he is on Zetia  fish oil however he is total cholesterol in March of this year 103 HDL 47 we will continue present management.   Medication Adjustments/Labs and Tests Ordered: Current medicines are reviewed at length with the patient today.  Concerns  regarding medicines are outlined above.  No orders of the defined types were placed in this encounter.  Medication changes: No orders of the defined types were placed in this encounter.   Signed, Lamar DOROTHA Fitch, MD, [facility] 10/24/2023 10:04 AM    [facility]

## 2023-11-09 DIAGNOSIS — I10 Essential (primary) hypertension: Secondary | ICD-10-CM | POA: Diagnosis not present

## 2023-11-10 LAB — BASIC METABOLIC PANEL WITH GFR
BUN/Creatinine Ratio: 20 (ref 10–24)
BUN: 24 mg/dL (ref 8–27)
CO2: 22 mmol/L (ref 20–29)
Calcium: 8.8 mg/dL (ref 8.6–10.2)
Chloride: 108 mmol/L — ABNORMAL HIGH (ref 96–106)
Creatinine, Ser: 1.19 mg/dL (ref 0.76–1.27)
Glucose: 86 mg/dL (ref 70–99)
Potassium: 4.9 mmol/L (ref 3.5–5.2)
Sodium: 143 mmol/L (ref 134–144)
eGFR: 61 mL/min/1.73 (ref >59)

## 2024-01-01 ENCOUNTER — Other Ambulatory Visit: Payer: Self-pay | Admitting: Cardiology

## 2024-01-08 ENCOUNTER — Other Ambulatory Visit: Payer: Self-pay | Admitting: Cardiology

## 2024-01-26 NOTE — Progress Notes (Signed)
 Electrophysiology Office Note:   Date:  01/29/2024  ID:  Scott Whitehead, DOB [birth year], MRN [id number]  Primary Cardiologist: Scott Fitch, MD Primary Heart Failure: None Electrophysiologist: Scott Duddy Gladis Norton, MD      History of Present Illness:   Scott Whitehead is a 83 y.o. male with h/o coronary arteries post LAD stent, atrial flutter post ablation, hyperlipidemia, PVCs seen today for routine electrophysiology followup.   July 2025, he had an echo that showed an ejection fraction of 45%.  This is down from a year ago where his ejection fraction was 60 to 65%.  Discussed the use of AI scribe software for clinical note transcription with the patient, who gave verbal consent to proceed.  History of Present Illness Scott Whitehead is an 83 year old male with premature ventricular contractions who presents with dizziness and concerns about heart function.  He experiences occasional dizzy spells, approximately once a month, characterized by a flush and dizziness lasting only a second or two before resolving. He denies any specific triggers for these episodes and reports no other symptoms aside from the occasional dizziness.  He is currently on mexiletine, but there is uncertainty about its effectiveness. His home blood pressure machine often shows a pulse reading in the thirties and forties, suggesting ongoing PVCs.  His heart function was noted to be slightly reduced last summer. He has a history of taking amiodarone  in the past.  No other symptoms aside from the occasional dizziness.   he denies chest pain, palpitations, dyspnea, PND, orthopnea, nausea, vomiting, syncope, edema, weight gain, or early satiety.   Review of systems complete and found to be negative unless listed in HPI.   EP Information / Studies Reviewed:    EKG is ordered today. Personal review as below.  EKG Interpretation Date/Time:  Monday January 29 2024 09:40:13 EST Ventricular Rate:  80 PR  Interval:  172 QRS Duration:  110 QT Interval:  390 QTC Calculation: 449 R Axis:   59  Text Interpretation: Sinus rhythm with frequent Premature ventricular complexes in a pattern of bigeminy Possible Left atrial enlargement When compared with ECG of 05-Jun-2023 08:35, No significant change was found Confirmed by Scott Whitehead ([id number]) on 01/29/2024 9:41:39 AM     Risk Assessment/Calculations:    CHA2DS2-VASc Score = 3   This indicates a 3.2% annual risk of stroke. The patient's score is based upon: CHF History: 0 HTN History: 0 Diabetes History: 0 Stroke History: 0 Vascular Disease History: 1 Age Score: 2 Gender Score: 0            Physical Exam:   VS:  BP 110/60 (BP Location: Right Arm, Patient Position: Sitting, Cuff Size: Normal)   Pulse 80   Ht 6' (1.829 m)   Wt 188 lb (85.3 kg)   SpO2 98%   BMI 25.50 kg/m    Wt Readings from Last 3 Encounters:  01/29/24 188 lb (85.3 kg)  10/24/23 183 lb 9.6 oz (83.3 kg)  06/05/23 189 lb 9.6 oz (86 kg)     GEN: Well nourished, well developed in no acute distress NECK: No JVD; No carotid bruits CARDIAC: Regular rate and rhythm with occasional ectopy, no murmurs, rubs, gallops RESPIRATORY:  Clear to auscultation without rales, wheezing or rhonchi  ABDOMEN: Soft, non-tender, non-distended EXTREMITIES:  No edema; No deformity   ASSESSMENT AND PLAN:    1.  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  On mexiletine.  Based on his EKG and auscultation, he is continuing to have PVCs.  In addition,  his heart rates are in the 30s to 40s at home.  Scott Whitehead stop mexiletine and load with amiodarone  today.  Scott Whitehead plan for repeat monitor in 1 month.  His ejection fraction is mildly reduced.  2.  Coronary artery disease: No current chest pain.  Plan per primary cardiology.  3.  Typical atrial flutter: Post ablation.  No anticoagulation needed at this time.  Follow up with EP Team in 6 months  Signed, Scott Strickling Gladis Norton, MD

## 2024-01-29 ENCOUNTER — Encounter: Payer: Self-pay | Admitting: Cardiology

## 2024-01-29 ENCOUNTER — Ambulatory Visit

## 2024-01-29 ENCOUNTER — Ambulatory Visit: Attending: Cardiology | Admitting: Cardiology

## 2024-01-29 VITALS — BP 110/60 | HR 80 | Ht 72.0 in | Wt 188.0 lb

## 2024-01-29 DIAGNOSIS — R002 Palpitations: Secondary | ICD-10-CM

## 2024-01-29 DIAGNOSIS — I483 Typical atrial flutter: Secondary | ICD-10-CM | POA: Diagnosis not present

## 2024-01-29 DIAGNOSIS — I493 Ventricular premature depolarization: Secondary | ICD-10-CM | POA: Diagnosis not present

## 2024-01-29 DIAGNOSIS — I472 Ventricular tachycardia, unspecified: Secondary | ICD-10-CM | POA: Diagnosis not present

## 2024-01-29 MED ORDER — AMIODARONE HCL 200 MG PO TABS: ORAL_TABLET | ORAL | 3 refills | Status: DC

## 2024-01-29 NOTE — Progress Notes (Unsigned)
 Enrolled patient for a 14 day Zio XT monitor to be mailed to patients around 02/19/24

## 2024-01-29 NOTE — Patient Instructions (Addendum)
 Medication Instructions:  Your physician has recommended you make the following change in your medication:  STOP Mexiletine START Amiodarone   - take 2 tablets (400 mg total) TWICE a day for 2 weeks, then  - take 1 tablet (200 mg total) TWICE a day for 2 weeks, then  - take 1 tablet (200 mg total) ONCE a day   *If you need a refill on your cardiac medications before your next appointment, please call your pharmacy*  Lab Work: None ordered  If you have any lab test that is abnormal or we need to change your treatment, we will call you to review the results.  Testing/Procedures:                           ZIO XT- Long Term Monitor Instructions  Your physician has requested you wear a ZIO patch monitor for 14 days -- you will start this in 1 month, after being on the Amiodarone  medication for a month.  This is a single patch monitor. [employer] supplies one patch monitor per enrollment. Additional stickers are not available. Please do not apply patch if you will be having a Nuclear Stress Test,  Echocardiogram, Cardiac CT, MRI, or Chest Xray during the period you would be wearing the  monitor. The patch cannot be worn during these tests. You cannot remove and re-apply the  ZIO XT patch monitor.  Your ZIO patch monitor will be mailed 3 day USPS to your address on file. It may take 3-5 days  to receive your monitor after you have been enrolled.  Once you have received your monitor, please review the enclosed instructions. Your monitor  has already been registered assigning a specific monitor serial # to you.  Billing and Patient Assistance Program Information  We have supplied [employer] with any of your insurance information on file for billing purposes. [employer] offers a sliding scale Patient Assistance Program for patients that do not have  insurance, or whose insurance does not completely cover the cost of the ZIO monitor.  You must apply for the Patient Assistance Program to qualify for  this discounted rate.  To apply, please call [employer] at [phone], select option 4, select option 2, ask to apply for  Patient Assistance Program. Meredeth will ask your household income, and how many people  are in your household. They will quote your out-of-pocket cost based on that information.  [employer] will also be able to set up a 59-month, interest-free payment plan if needed.  Applying the monitor   Shave hair from upper left chest.  Hold abrader disc by orange tab. Rub abrader in 40 strokes over the upper left chest as  indicated in your monitor instructions.  Clean area with 4 enclosed alcohol pads. Let dry.  Apply patch as indicated in monitor instructions. Patch will be placed under collarbone on left  side of chest with arrow pointing upward.  Rub patch adhesive wings for 2 minutes. Remove white label marked 1. Remove the white  label marked 2. Rub patch adhesive wings for 2 additional minutes.  While looking in a mirror, press and release button in center of patch. A small green light will  flash 3-4 times. This will be your only indicator that the monitor has been turned on.  Do not shower for the first 24 hours. You may shower after the first 24 hours.  Press the button if you feel a symptom. You will hear a small click.  Record Date, Time and  Symptom in the Patient Logbook.  When you are ready to remove the patch, follow instructions on the last 2 pages of Patient  Logbook. Stick patch monitor onto the last page of Patient Logbook.  Place Patient Logbook in the blue and white box. Use locking tab on box and tape box closed  securely. The blue and white box has prepaid postage on it. Please place it in the mailbox as  soon as possible. Your physician should have your test results approximately 7 days after the  monitor has been mailed back to [facility].  Call [facility] Customer Care at [phone] if you have questions regarding  your ZIO XT patch monitor.  Call them immediately if you see an orange light blinking on your  monitor.  If your monitor falls off in less than 4 days, contact our Monitor department at [phone].  If your monitor becomes loose or falls off after 4 days call [employer] at [phone] for  suggestions on securing your monitor    Follow-Up: At [facility], you and your health needs are our priority.  As part of our continuing mission to provide you with exceptional heart care, our providers are all part of one team.  This team includes your primary Cardiologist (physician) and Advanced Practice Providers or APPs (Physician Assistants and Nurse Practitioners) who all work together to provide you with the care you need, when you need it.  Your next appointment:   6 month(s)  Provider:   Soyla Norton, MD     Thank you for choosing [facility]!!   Maeola Domino, RN [phone]   Other Instructions  Amiodarone  Tablets What is this medication? AMIODARONE  (a MEE oh da rone) prevents and treats a fast or irregular heartbeat (arrhythmia). It works by slowing down overactive electric signals in the heart, which stabilizes your heart rhythm. It belongs to a group of medications called antiarrhythmics. This medicine may be used for other purposes; ask your health care provider or pharmacist if you have questions. COMMON BRAND NAME(S): Cordarone , Pacerone  What should I tell my care team before I take this medication? They need to know if you have any of these conditions: Liver disease Lung disease Other heart problems Thyroid  disease An unusual or allergic reaction to amiodarone , iodine, other medications, foods, dyes, or preservatives Pregnant or trying to get pregnant Breast-feeding How should I use this medication? Take this medication by mouth with water. Take it as directed on the prescription label at the same time every day. You can take it with or without food. You should always take it the  same way. Keep taking it unless your care team tells you to stop. A special MedGuide will be given to you by the pharmacist with each prescription and refill. Be sure to read this information carefully each time. Talk to your care team about the use of this medication in children. Special care may be needed. Overdosage: If you think you have taken too much of this medicine contact a poison control center or emergency room at once. NOTE: This medicine is only for you. Do not share this medicine with others. What if I miss a dose? If you miss a dose, take it as soon as you can. If it is almost time for your next dose, take only that dose. Do not take double or extra doses. What may interact with this medication? Do not take this medication with any of the following: Abarelix Apomorphine Arsenic  trioxide Certain antibiotics, such as erythromycin, gemifloxacin, levofloxacin, or pentamidine Certain medications for depression, such as amoxapine or tricyclic antidepressants Certain medications for fungal infections, such as fluconazole, itraconazole, ketoconazole, posaconazole, or voriconazole Certain medications for irregular heartbeat, such as disopyramide, dronedarone, ibutilide, propafenone, or sotalol Certain medications for malaria, such as chloroquine or halofantrine Cisapride Droperidol Haloperidol [place] Maprotiline Methadone Phenothiazines, such as chlorpromazine, mesoridazine, or thioridazine Pimozide Ranolazine  Red yeast rice Vardenafil This medication may also interact with the following: Antivirals for HIV Certain medications for blood pressure, heart disease, irregular heartbeat Certain medications for cholesterol, such as atorvastatin, cerivastatin, lovastatin, or simvastatin Certain medications for hepatitis C, such as sofosbuvir and ledipasvir; sofosbuvir Certain medications for seizures, such as phenytoin Certain medications for thyroid  problems Certain medications  that prevent or treat blood clots, such as warfarin Cholestyramine Cimetidine Clopidogrel Cyclosporine Dextromethorphan Diuretics Dofetilide Fentanyl  General anesthetics Grapefruit juice Lidocaine  Loratadine Methotrexate Other medications that cause heart rhythm changes Procainamide Quinidine Rifabutin, rifampin, or rifapentine St. John's Wort Trazodone Ziprasidone This list may not describe all possible interactions. Give your health care provider a list of all the medicines, herbs, non-prescription drugs, or dietary supplements you use. Also tell them if you smoke, drink alcohol, or use illegal drugs. Some items may interact with your medicine. What should I watch for while using this medication? Your condition will be monitored closely when you first begin therapy. This medication is often started in a hospital or other monitored health care setting. Once you are on maintenance therapy, visit your care team for regular checks on your progress. Because your condition and use of this medication carry some risk, it is a good idea to carry an identification card, necklace, or bracelet with details of your condition, medications, and care team. This medication may affect your coordination, reaction time, or judgment. Do not drive or operate machinery until you know how this medication affects you. Sit up or stand slowly to reduce the risk of dizzy or fainting spells. Drinking alcohol with this medication can increase the risk of these side effects. This medication can make you more sensitive to the sun. Keep out of the sun. If you cannot avoid being in the sun, wear protective clothing and sunscreen. Do not use sun lamps, tanning beds, or tanning booths. You should have regular eye exams before and during treatment. Call your care team if you have blurred vision, see halos, or your eyes become sensitive to light. Your eyes may get dry. It may be helpful to use a lubricating eye solution or  artificial tears solution. If you are going to have surgery or a procedure that requires contrast dyes, tell your care team that you are taking this medication. What side effects may I notice from receiving this medication? Side effects that you should report to your care team as soon as possible: Allergic reactions--skin rash, itching, hives, swelling of the face, lips, tongue, or throat Bluish-gray skin Change in vision such as blurry vision, seeing halos around lights, vision loss Heart failure--shortness of breath, swelling of the ankles, feet, or hands, sudden weight gain, unusual weakness or fatigue Heart rhythm changes--fast or irregular heartbeat, dizziness, feeling faint or lightheaded, chest pain, trouble breathing High thyroid  levels (hyperthyroidism)--fast or irregular heartbeat, weight loss, excessive sweating or sensitivity to heat, tremors or shaking, anxiety, nervousness, irregular menstrual cycle or spotting Liver injury--right upper belly pain, loss of appetite, nausea, light-colored stool, dark yellow or brown urine, yellowing skin or eyes, unusual weakness or fatigue Low thyroid  levels (  hypothyroidism)--unusual weakness or fatigue, sensitivity to cold, constipation, hair loss, dry skin, weight gain, feelings of depression Lung injury--shortness of breath or trouble breathing, cough, spitting up blood, chest pain, fever Pain, tingling, or numbness in the hands or feet, muscle weakness, trouble walking, loss of balance or coordination Side effects that usually do not require medical attention (report to your care team if they continue or are bothersome): Nausea Vomiting This list may not describe all possible side effects. Call your doctor for medical advice about side effects. You may report side effects to FDA at 1-800-FDA-1088. Where should I keep my medication? Keep out of the reach of children and pets. Store at room temperature between 20 and 25 degrees C (68 and 77  degrees F). Protect from light. Keep container tightly closed. Throw away any unused medication after the expiration date. NOTE: This sheet is a summary. It may not cover all possible information. If you have questions about this medicine, talk to your doctor, pharmacist, or health care provider.  2024 Elsevier/Gold Standard (2021-08-20 00:00:00)

## 2024-02-29 ENCOUNTER — Encounter: Payer: Self-pay | Admitting: Cardiology

## 2024-02-29 ENCOUNTER — Ambulatory Visit: Attending: Cardiology | Admitting: Cardiology

## 2024-02-29 VITALS — BP 100/50 | HR 57 | Ht 72.0 in | Wt 191.0 lb

## 2024-02-29 DIAGNOSIS — I2510 Atherosclerotic heart disease of native coronary artery without angina pectoris: Secondary | ICD-10-CM

## 2024-02-29 DIAGNOSIS — I10 Essential (primary) hypertension: Secondary | ICD-10-CM

## 2024-02-29 DIAGNOSIS — Z9861 Coronary angioplasty status: Secondary | ICD-10-CM

## 2024-02-29 DIAGNOSIS — E782 Mixed hyperlipidemia: Secondary | ICD-10-CM

## 2024-02-29 DIAGNOSIS — I420 Dilated cardiomyopathy: Secondary | ICD-10-CM

## 2024-02-29 DIAGNOSIS — R0609 Other forms of dyspnea: Secondary | ICD-10-CM | POA: Diagnosis not present

## 2024-02-29 DIAGNOSIS — I493 Ventricular premature depolarization: Secondary | ICD-10-CM | POA: Diagnosis not present

## 2024-02-29 NOTE — Patient Instructions (Signed)
 Medication Instructions:  Your physician recommends that you continue on your current medications as directed. Please refer to the Current Medication list given to you today.  *If you need a refill on your cardiac medications before your next appointment, please call your pharmacy*   Lab Work: None Ordered If you have labs (blood work) drawn today and your tests are completely normal, you will receive your results only by: MyChart Message (if you have MyChart) OR A paper copy in the mail If you have any lab test that is abnormal or we need to change your treatment, we will call you to review the results.   Testing/Procedures: End of March Your physician has requested that you have an echocardiogram. Echocardiography is a painless test that uses sound waves to create images of your heart. It provides your doctor with information about the size and shape of your heart and how well your hearts chambers and valves are working. This procedure takes approximately one hour. There are no restrictions for this procedure. Please do NOT wear cologne, perfume, aftershave, or lotions (deodorant is allowed). Please arrive 15 minutes prior to your appointment time.  Please note: We ask at that you not bring children with you during ultrasound (echo/ vascular) testing. Due to room size and safety concerns, children are not allowed in the ultrasound rooms during exams. Our front office staff cannot provide observation of children in our lobby area while testing is being conducted. An adult accompanying a patient to their appointment will only be allowed in the ultrasound room at the discretion of the ultrasound technician under special circumstances. We apologize for any inconvenience.    Follow-Up: At [facility], you and your health needs are our priority.  As part of our continuing mission to provide you with exceptional heart care, we have created designated Provider Care Teams.  These Care Teams  include your primary Cardiologist (physician) and Advanced Practice Providers (APPs -  Physician Assistants and Nurse Practitioners) who all work together to provide you with the care you need, when you need it.  We recommend signing up for the patient portal called MyChart.  Sign up information is provided on this After Visit Summary.  MyChart is used to connect with patients for Virtual Visits (Telemedicine).  Patients are able to view lab/test results, encounter notes, upcoming appointments, etc.  Non-urgent messages can be sent to your provider as well.   To learn more about what you can do with MyChart, go to [email or web address].    Your next appointment:   3 month(s)  The format for your next appointment:   In Person  Provider:   Lamar Fitch, MD    Other Instructions NA

## 2024-02-29 NOTE — Progress Notes (Signed)
 " Cardiology Office Note:    Date:  02/29/2024   ID:  Scott Whitehead, DOB September 12, 1940, MRN [id number]  PCP:  Street, Lonni HERO, MD  Cardiologist:  Lamar Fitch, MD    Referring MD: Street, Lonni HERO, MD   Chief Complaint  Patient presents with   Follow-up  Doing better  History of Present Illness:    Scott Whitehead is a 84 y.o. male past medical history significant for coronary artery disease he did have PTCA and stenting of LAD done many years ago, subsequent atrial flutter ablation done years ago by Dr. 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  He is very busy taking care of his wife who did have a stroke in December of last year.  He was put initially on mexiletine that cause significant bradycardia mexiletine has been discontinued now he is taking amiodarone  looks like successful suppression of his PVCs.  Past Medical History:  Diagnosis Date   Angina pectoris 09/09/2016   CAD S/P percutaneous coronary angioplasty    Stent to LAD many years ago   Cancer [facility])    Coronary artery disease involving native coronary artery 04/02/2015   Cardiac catheterization in the middle of 2018 showing nonobstructive lesion PET patent stent site   Dizziness 11/09/2016   Dyspnea on exertion 12/24/2018   Essential hypertension 04/28/2016   GERD (gastroesophageal reflux disease)    High risk medication use 06/05/2015   Hyperlipidemia    Hyperlipidemia with target low density lipoprotein (LDL) cholesterol less than 70 mg/dL 92/68/7981   Hypertension    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   Status post ablation of atrial flutter 04/02/2015   Status post placement of implantable loop recorder 06/02/2017   Battery already depleted patient does not want to have  it removed   Syncope 04/02/2015   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   VT (ventricular tachycardia) (HCC) 04/02/2015    Past Surgical History:  Procedure Laterality Date   CARDIAC SURGERY     heart monitor is placed in the heart area   ENDOVENOUS ABLATION SAPHENOUS VEIN W/ LASER Right 03/08/2018   endovenous laser ablation right greater saphenous vein by Lonni Blade MD    LEFT HEART CATH AND CORONARY ANGIOGRAPHY N/A 09/13/2016   Procedure: Left Heart Cath and Coronary Angiography;  Surgeon: Anner Alm ORN, MD;  Location: [facility] INVASIVE CV LAB;  Service: Cardiovascular;  Laterality: N/A;   LOOP RECORDER IMPLANT     stab phlebectomy  Right 08/23/2018   stab phlebectomy 10-20 incisions right leg by Shane Blade MD     Current Medications: Active Medications[1]   Allergies:   Patient has no known allergies.   Social History   Socioeconomic History   Marital status: Married    Spouse name: Not on file   Number of children: Not on file   Years of education: Not on file   Highest education level: Not on file  Occupational History   Not on file  Tobacco Use   Smoking status: Never   Smokeless tobacco: Never  Vaping Use   Vaping status: Never Used  Substance and Sexual Activity   Alcohol use: No   Drug use: No   Sexual activity: Not on file  Other Topics Concern   Not on file  Social History Narrative  Not on file   Social Drivers of Health   Tobacco Use: Low Risk (02/29/2024)   Patient History    Smoking Tobacco Use: Never    Smokeless Tobacco Use: Never    Passive Exposure: Not on file  Financial Resource Strain: Not on file  Food Insecurity: Not on file  Transportation Needs: Not on file  Physical Activity: Not on file  Stress: Not on file  Social Connections: Unknown (06/29/2021)   Received from [facility]   Social Network    Social Network: Not on file  Depression (PHQ2-9): Not on file  Alcohol Screen: Not on file  Housing: Not on file   Utilities: Not on file  Health Literacy: Not on file     Family History: The patient's family history includes Alzheimer's disease in his father; Blindness in his father; Glaucoma in his father; Heart disease in his mother; Hypertension in his father and mother. ROS:   Please see the history of present illness.    All 14 point review of systems negative except as described per history of present illness  EKGs/Labs/Other Studies Reviewed:         Recent Labs: 10/10/2023: NT-Pro BNP 326 11/09/2023: BUN 24; Creatinine, Ser 1.19; Potassium 4.9; Sodium 143  Recent Lipid Panel    Component Value Date/Time   CHOL 104 12/31/2020 1055   TRIG 83 12/31/2020 1055   HDL 46 12/31/2020 1055   CHOLHDL 2.3 12/31/2020 1055   LDLCALC 41 12/31/2020 1055    Physical Exam:    VS:  BP (!) 100/50   Pulse (!) 57   Ht 6' (1.829 m)   Wt 191 lb (86.6 kg)   SpO2 98%   BMI 25.90 kg/m     Wt Readings from Last 3 Encounters:  02/29/24 191 lb (86.6 kg)  01/29/24 188 lb (85.3 kg)  10/24/23 183 lb 9.6 oz (83.3 kg)     GEN:  Well nourished, well developed in no acute distress HEENT: Normal NECK: No JVD; No carotid bruits LYMPHATICS: No lymphadenopathy CARDIAC: RRR, no murmurs, no rubs, no gallops RESPIRATORY:  Clear to auscultation without rales, wheezing or rhonchi  ABDOMEN: Soft, non-tender, non-distended MUSCULOSKELETAL:  No edema; No deformity  SKIN: Warm and dry LOWER EXTREMITIES: no swelling NEUROLOGIC:  Alert and oriented x 3 PSYCHIATRIC:  Normal affect   ASSESSMENT:    1. Essential hypertension   2. Dilated cardiomyopathy (HCC) ejection fraction 45%   3. CAD S/P percutaneous coronary angioplasty   4. Ventricular ectopy   5. Mixed hyperlipidemia    PLAN:    In order of problems listed above:  Frequent ventricular ectopy, on amiodarone  200 g daily will continue for now, no PVCs noted on EKG, he is wearing Zio patch right now. Cardiomyopathy mildly reduced left ventricle  ejection fraction on guideline directed medical therapy that he can tolerate.  He did have a stress test on him 04/17/2022 which showing no evidence of ischemia but with will consider ischemia workup in the future. Frequent ventricular ectopy, morphology of QRS complex indicate left ventricle and the bottom of the heart close to the apex.  Successfully suppressed with amiodarone .  Again wearing Zio patch. Dyslipidemia on a medication which I will continue. Essential hypertension blood pressure actually low   Medication Adjustments/Labs and Tests Ordered: Current medicines are reviewed at length with the patient today.  Concerns regarding medicines are outlined above.  Orders Placed This Encounter  Procedures   EKG 12-Lead   Medication changes: No orders  of the defined types were placed in this encounter.   Signed, Lamar DOROTHA Fitch, MD, [facility] 02/29/2024 9:51 AM    [facility]    [1]  Current Meds  Medication Sig   amiodarone  (PACERONE ) 200 MG tablet Take 200 mg by mouth daily.   aspirin  EC 81 MG tablet Take 81 mg by mouth daily.   diphenhydrAMINE (BENADRYL) 25 MG tablet Take 25 mg by mouth every 6 (six) hours as needed for sleep.   ezetimibe  (ZETIA ) 10 MG tablet TAKE 1 TABLET BY MOUTH DAILY   finasteride (PROSCAR) 5 MG tablet Take 1 tablet by mouth daily.   ibuprofen (ADVIL) 400 MG tablet Take 400 mg by mouth daily.   MAGNESIUM PO Take 1 tablet by mouth daily. Unknown strength   Melatonin 10 MG TABS Take 1 tablet by mouth at bedtime as needed (sleep).   metoprolol  tartrate (LOPRESSOR ) 25 MG tablet Take 0.5 tablets (12.5 mg total) by mouth 2 (two) times daily.   Multiple Vitamin (MULTIVITAMIN WITH MINERALS) TABS tablet Take 1 tablet by mouth daily. Unknown strength   MYRBETRIQ 50 MG TB24 tablet Take 50 mg by mouth daily.   nitroGLYCERIN  (NITROSTAT ) 0.4 MG SL tablet Place 1 tablet (0.4 mg total) under the tongue every 5 (five) minutes as needed for chest pain.    Omega-3 Fatty Acids (FISH OIL) 1000 MG CAPS Take 1,000 mg by mouth 2 (two) times daily.    ranolazine  (RANEXA ) 500 MG 12 hr tablet TAKE 1 TABLET BY MOUTH TWICE  DAILY   rosuvastatin  (CRESTOR ) 40 MG tablet TAKE 1 TABLET BY MOUTH DAILY   sacubitril -valsartan  (ENTRESTO ) 24-26 MG TAKE 1 TABLET BY MOUTH TWICE  DAILY   tamsulosin (FLOMAX) 0.4 MG CAPS capsule Take 0.4 mg by mouth daily.   "

## 2024-03-21 DIAGNOSIS — I493 Ventricular premature depolarization: Secondary | ICD-10-CM | POA: Diagnosis not present

## 2024-03-21 DIAGNOSIS — I472 Ventricular tachycardia, unspecified: Secondary | ICD-10-CM | POA: Diagnosis not present

## 2024-03-21 DIAGNOSIS — R002 Palpitations: Secondary | ICD-10-CM

## 2024-05-14 ENCOUNTER — Ambulatory Visit

## 2024-05-30 ENCOUNTER — Ambulatory Visit: Admitting: Cardiology
